# Patient Record
Sex: Male | Born: 1945 | Race: White | Hispanic: No | Marital: Married | Smoking: Former smoker
Health system: Southern US, Community
[De-identification: ages and names within clinical notes are randomized; demographics above are authoritative.]

## PROBLEM LIST (undated history)

## (undated) DIAGNOSIS — F17200 Nicotine dependence, unspecified, uncomplicated: Secondary | ICD-10-CM

## (undated) DIAGNOSIS — N400 Enlarged prostate without lower urinary tract symptoms: Secondary | ICD-10-CM

## (undated) DIAGNOSIS — D126 Benign neoplasm of colon, unspecified: Secondary | ICD-10-CM

## (undated) DIAGNOSIS — E1165 Type 2 diabetes mellitus with hyperglycemia: Secondary | ICD-10-CM

## (undated) DIAGNOSIS — IMO0001 Reserved for inherently not codable concepts without codable children: Secondary | ICD-10-CM

## (undated) DIAGNOSIS — I2119 ST elevation (STEMI) myocardial infarction involving other coronary artery of inferior wall: Secondary | ICD-10-CM

## (undated) DIAGNOSIS — R55 Syncope and collapse: Secondary | ICD-10-CM

## (undated) DIAGNOSIS — R413 Other amnesia: Secondary | ICD-10-CM

## (undated) DIAGNOSIS — I10 Essential (primary) hypertension: Secondary | ICD-10-CM

## (undated) DIAGNOSIS — I2510 Atherosclerotic heart disease of native coronary artery without angina pectoris: Secondary | ICD-10-CM

## (undated) DIAGNOSIS — N200 Calculus of kidney: Secondary | ICD-10-CM

## (undated) DIAGNOSIS — E785 Hyperlipidemia, unspecified: Secondary | ICD-10-CM

## (undated) DIAGNOSIS — E7800 Pure hypercholesterolemia, unspecified: Secondary | ICD-10-CM

## (undated) HISTORY — DX: Other amnesia: R41.3

## (undated) HISTORY — DX: Type 2 diabetes mellitus with hyperglycemia: E11.65

## (undated) HISTORY — DX: Essential (primary) hypertension: I10

## (undated) HISTORY — DX: Nicotine dependence, unspecified, uncomplicated: F17.200

## (undated) HISTORY — DX: Reserved for inherently not codable concepts without codable children: IMO0001

## (undated) HISTORY — PX: TRANSURETHRAL RESECTION OF PROSTATE: SHX73

## (undated) HISTORY — DX: Benign neoplasm of colon, unspecified: D12.6

## (undated) HISTORY — DX: Atherosclerotic heart disease of native coronary artery without angina pectoris: I25.10

## (undated) HISTORY — DX: Pure hypercholesterolemia, unspecified: E78.00

## (undated) HISTORY — DX: Calculus of kidney: N20.0

## (undated) HISTORY — DX: Syncope and collapse: R55

---

## 2007-05-10 DIAGNOSIS — I2119 ST elevation (STEMI) myocardial infarction involving other coronary artery of inferior wall: Secondary | ICD-10-CM

## 2007-05-10 HISTORY — DX: ST elevation (STEMI) myocardial infarction involving other coronary artery of inferior wall: I21.19

## 2007-10-08 HISTORY — PX: CORONARY STENT PLACEMENT: SHX1402

## 2007-10-30 ENCOUNTER — Ambulatory Visit: Payer: Self-pay | Admitting: Cardiovascular Disease

## 2007-10-30 ENCOUNTER — Inpatient Hospital Stay (HOSPITAL_COMMUNITY): Admission: AD | Admit: 2007-10-30 | Discharge: 2007-11-01 | Payer: Self-pay | Admitting: Cardiology

## 2007-11-14 ENCOUNTER — Ambulatory Visit: Payer: Self-pay | Admitting: Cardiovascular Disease

## 2007-11-15 ENCOUNTER — Encounter (HOSPITAL_COMMUNITY): Admission: RE | Admit: 2007-11-15 | Discharge: 2008-02-13 | Payer: Self-pay | Admitting: Cardiology

## 2010-09-21 NOTE — Cardiovascular Report (Signed)
Chad [name], Chad [name]               ACCOUNT NO.:  [id number]   MEDICAL RECORD NO.:  [id number]          PATIENT TYPE:  OIB   LOCATION:  2807                         FACILITY:  [facility]   PHYSICIAN:  Everardo Beals. Juanda Chance, MD, FACCDATE OF BIRTH:  1945/08/13   DATE OF PROCEDURE:  10/30/2007  DATE OF DISCHARGE:                            CARDIAC CATHETERIZATION   CLINICAL HISTORY:  Mr. Halley is 65 years old and has no prior history  of heart disease although he does have hyperlipidemia and diabetes.  He  had the onset of chest pain at 4 p.m. and was brought to the emergency  room by EMS.  His electrocardiogram was transferred via EMS to [facility]  Emergency Department in the Cath Lab, and he was brought directly to the  Cath Lab for intervention following a code STEMI call.  He was seen in  the Cath Lab by Dr. Eden Emms and then myself.   PROCEDURE:  The procedure was performed by the right femoral arterial  sheath and 6-French preformed coronary catheters.  After it was  recognized there was total occlusion in mid right coronary artery,  preparation made for intervention.  The patient was given weight-  adjusted heparin upon ACT greater than 10 seconds.  We used a 6-French  JR4 guiding catheter with side holes and passed a Prowater wire across  the lesion without much difficulty.  This did establish TIMI 2 flow.  We  then went in with a 1.5 x 20-mm clear right catheter and gave a bolus of  intracoronary abciximab through the clear right catheter.  This  established better reperfusion.  We then predilated the lesion with a  2.5 x 15-mm Apex balloon performing 1 inflation up to 8 atmospheres for  30 seconds.  We then deployed a 3.0 x 16-mm Liberte deploying this with  1 inflation of 17 atmospheres for 30 seconds.  We postdilated the stent  with a 3.5 x 12-mm [place] Voyager performing 2 inflations up to 16  atmospheres for 30 seconds.  The patient developed some hypotension,  bradycardia, and nausea requiring  dopamine following reperfusion.  Otherwise, he tolerated the procedure well.  The left ventriculogram was  performed after the coronary intervention.  Right femoral artery was  closed with Angio-Seal at the end of procedure.  The patient tolerated  the procedure well and left the laboratory in satisfactory condition.   RESULTS:  Left main coronary artery:  The left main coronary artery was  free of significant disease.   Left anterior descending artery:  The left anterior descending artery  gave rise to a moderately large diagonal branch and 2 septal  perforators.  There was tandem 50%-70% stenosis in mid LAD after the  diagonal branch and first septal perforator.  There was 80% stenosis in  the first diagonal branch.   Circumflex artery:  The circumflex artery gave rise to a marginal branch  and 2-part large posterolateral branches.  There was 30% narrowing in  the proximal circumflex artery.   Right coronary artery:  The right coronary artery was a moderate-sized  vessel that had a 50%  stenosis in its proximal mid portion, then was  completely occluded at its midportion.   Left ventriculogram:  The left ventriculogram was performed on the RAO  projection showed minimal hypokinesis of the inferobasal wall.  The  estimated ejection fraction was 60%.   Following intracoronary abciximab and following PTCA and stenting of the  lesion in mid right carotid, stenosis improved from 100% to 0% and the  flow improved from TIMI 0 to TIMI 3 flow.   HEMODYNAMIC DATA:  The aortic pressure was 120/59 with mean of 84 and  left ventricular pressure was 125/20.   CONCLUSION:  1. Acute diaphragmatic wall infarction with total of 50% proximal to      mid and total occlusion of the mid right coronary artery, 30% area      in the proximal circumflex artery, 50%-70% stenosis in the mid left      anterior descending artery with 80% stenosis in the first diagonal      branch and minimal inferobasal  wall hypokinesis with an estimated      ejection fraction of 60%.  2. Successful PCI of the totally occluded mid right coronary artery      using intracoronary abciximab through clear right catheter and      stenting with a Liberte bare metal stent with improvement in center      narrowing from 100% to 0% and improvement in flow from TIMI 0 to      TIMI 3 flow.   DISPOSITION:  The patient returned to Post Angio Room for further  observation.  We will plan medical treatment of residual coronary  disease.  We will target discharge on day 2.  The patient had the onset  of chest pain at 1600 and arrived at [facility] at 1730.  The  first balloon inflation was at 1756.  This gave a total balloon time of  26 minutes and refusion time of 1 hour 56 minutes.   The patient was enrolled in the review of study and will receive either  EPO or placebo followed by a late MRI.      Bruce Elvera Lennox Juanda Chance, MD, [facility]  Electronically Signed     BRB/MEDQ  D:  10/30/2007  T:  10/31/2007  Job:  [id number]   cc:   Noralyn Pick. Eden Emms, MD, [facility]

## 2010-09-21 NOTE — Discharge Summary (Signed)
NAMEKOLE, HILYARD NO.:  [id number]   MEDICAL RECORD NO.:  [id number]          PATIENT TYPE:  INP   LOCATION:  2916                         FACILITY:  [facility]   PHYSICIAN:  Noralyn Pick. Eden Emms, MD, FACCDATE OF BIRTH:  [birth year]   DATE OF ADMISSION:  10/30/2007  DATE OF DISCHARGE:  11/01/2007                               DISCHARGE SUMMARY   PRIMARY CARDIOLOGIST:  Theron Arista C. Eden Emms, MD, [facility]   PRIMARY CARE Omri Bertran:  [name], MD at [facility] physician.   DISCHARGE DIAGNOSIS:  Acute inferior ST-segment elevation myocardial  infarction.   SECONDARY DIAGNOSES:  1. Coronary artery disease, status post successful PCI and stenting of      the mid-right coronary artery with placement of 3.0 x 16 mm Liberte      bare metal stent.  The patient has residual 50-70% mid LAD disease,      8% diagonal disease.  2. Type 2 diabetes mellitus, diagnosed few years ago.  3. Hyperlipidemia.  4. Ongoing tobacco abuse.   ALLERGIES:  No known drug allergies.   PROCEDURE:  Left heart rate catheterization with successful PCI and  stenting of the mid-right coronary artery as described above.   HISTORY OF PRESENT ILLNESS:  A 65 year old married Caucasian male  without prior cardiac history.  Prior risk factors include family  history, hyperlipidemia, diabetes, and ongoing tobacco abuse.  He was in  his usual state of health until approximately 4 o'clock p.m. on October 30, 2007, when he developed sudden onset of 10/10 substernal chest pressure  with shortness of breath and diaphoresis.  He eventually presented to  [facility] where ECG showed inferior ST-segment elevation  and code STEMI was activated.  He was treated with 4 baby aspirin and 3  sublingual nitroglycerin with complete relief of chest discomfort on  route to [facility].  He arrived at [facility] at 5:30 p.m. for  emergent cardiac catheterization.  He was pain-free at the time of  arrival, however,  had residual inferior ST-segment elevation.   HOSPITAL COURSE:  The patient underwent emergent left heart cardiac  catheterization revealing an occluded mid-right coronary artery with  moderate LAD, diagonal and proximal RCA disease.  The RCA was felt to be  the infarct vessel and this was successfully stented with 3.0 x 60 mm  Liberte bare metal stent.  EF was 60% with question of slight basal  inferobasal hypokinesis.  The patient tolerated the procedure well and  was enrolled in the Reveal trial.  He was monitored in the coronary  intensive care unit where he eventually peaked his CK at 534, MB at  49.7, and troponin I at 22.83.  Postprocedure, he had no recurrent chest  discomfort.  He has been seen by both cardiac rehab as well as the  tobacco cessation team.  The patient plans to quit smoking cold [place].  He has not had any recurrent symptoms and has been ambulating without  difficulty.  We will plan to discharge him home today and he is to  follow up with Dr. Eden Emms in early  July 2009.  We will plan an  outpatient Myoview in approximately 6-8 weeks in order to evaluate  possible ischemic burden related to his moderate LAD and diagonal  disease.  The patient is being discharged home today in good condition.   DISCHARGE LABORATORY DATA:  Hemoglobin 15.0, hematocrit 44.0, WBC 9.4,  platelets 153, MCV 92.1, sodium 141, potassium 3.8, chloride 107, CO2 of  24, BUN 10, creatinine 1.13, glucose 206, total bilirubin 0.6, alkaline  phosphatase 64, AST 22, ALT 36, albumin 3.7, CK 227, MB 14.0, troponin I  7.53, total cholesterol 137, triglycerides 133, HDL 21, LDL 89, calcium  8.9, TSH 8.119, and BNP 33.0   DISPOSITION:  The patient is being discharged home today in good  condition.   FOLLOWUP PLANS AND APPOINTMENTS:  As noted above.  He is to follow with  Dr. Eden Emms on November 14, 2007, at 9:30 a.m.  He is to follow with his  primary care Cayetano Mikita in the next 1-2 weeks.  We will eventually  arrange  outpatient Myoview in 6-8 weeks to evaluate for possible ischemia in the  LAD and diagonal distributions.   DISCHARGE MEDICATIONS:  1. Aspirin 325 mg daily.  2. Plavix 75 mg daily.  3. Lipitor 80 mg nightly.  4. Lopressor 25 mg b.i.d.  5. Multivitamin 1 daily.  6. Metformin as previously prescribed to be resumed on November 02, 2007.  7. Nitroglycerin 0.4 mg sublingual p.r.n. chest pain.   OUTSTANDING LABORATORY STUDIES:  None.   DURATION OF DISCHARGE ENCOUNTER:  60 minutes including physician time.      Nicolasa Ducking, ANP      Noralyn Pick. Eden Emms, MD, [facility]  Electronically Signed    CB/MEDQ  D:  11/01/2007  T:  11/02/2007  Job:  [id number]   cc:   Noralyn Pick. Eden Emms, MD, [facility]  [name], M.D.

## 2010-09-21 NOTE — Assessment & Plan Note (Signed)
[facility]                            CARDIOLOGY OFFICE NOTE   EDNA, GROVER                        MRN:          [id number]  DATE:11/14/2007                            DOB:          [birth year]    HISTORY:  Mr. Serano returns today for followup.  He was a recent  admission from November 01, 2007, with acute inferior wall MI.  His right  coronary artery was dilated.  He has residual 50-60% mid LAD disease.  He needs a followup stress Myoview at the end of August 2009.   His risk factors included heavy smoking.  He maintains that he has not  smoked since hospital discharge.  He is a diabetic.  His diabetes has  been somewhat out of control.  He indicates his postprandial sugars have  been over 200 and some fasting sugars have been as high as 300.  I told  him I was extremely concerned about this.  He is only taking his  Glucophage once a day for the time being, I told him to take it b.i.d.  He will follow with Dr. [name].  I suspect that the levels of  hemoglobin A1c are in the mid 8 and this increases his risk of  restenosis and recurrent coronary artery disease.  I suspect that he may  need to be on Janumet or another medication.   I urged him to take his fasting sugars regularly and postprandial sugars  and follow up with Dr. Lenise Arena this week.   In regards to his heart, he is doing well.  He is active.  He is even  playing some basketball.  He has not had any significant chest pain,  PND, or orthopnea.  He has some bruising in his leg, which is going down  the inside of his thigh, which is normal.  He did have a bit of an  ecchymosis.   REVIEW OF SYSTEMS:  Otherwise negative.   MEDICATIONS:  1. Aspirin a day.  2. Plavix 75 a day.  3. Lipitor 80 a day.  4. Lopressor 25 b.i.d.  5. Multivitamins.  6. Metformin, he is only taking it once a day.  He is not sure of the      dose, but it is probably 500 mg.   PHYSICAL EXAMINATION:   GENERAL:  Remarkable for a thin [place] male.  There is still nicotine on his breath, but he says it is from his wife.  VITAL SIGNS:  Blood pressure is 118/84, pulse 76 and regular, weight  184, respiratory rate 14, and afebrile.  HEENT:  Unremarkable.  NECK:  Carotids are normal without bruit.  No lymphadenopathy,  thyromegaly, or JVP elevation.  LUNGS:  Clear.  Good diaphragmatic motion.  No wheezing.  S1 and S2.  Normal heart sounds.  PMI normal.  ABDOMEN:  Benign.  Bowel sounds positive.  No AAA, no tenderness, no  bruit, no hepatosplenomegaly, and no hepatojugular reflux.  EXTREMITIES:  He has no right femoral bruit, but he has an ecchymosis  there with a resolving hematoma and some ecchymosis down  the inside of  his right thigh.  There is no evidence of AV fistula or pseudoaneurysm.  Distal pulses are intact.  No edema.  NEUROLOGIC:  Nonfocal.  SKIN:  Warm and dry.  MUSCULOSKELETAL:  No muscular weakness.   IMPRESSION:  1. Recent inferior wall myocardial infarction.  Continue aspirin and      beta-blocker.  2. Residual left anterior descending artery disease.  Follow up      adenosine Myoview at the end of the month.  He has sublingual      nitroglycerin with him.  He will call me if he has any chest pain      to be enroll in cardiac rehab.  3. Hyperlipidemia in the setting of coronary artery disease.  Continue      Lipitor.  Lipid and liver profile when he gets a stress test at the      end of August 2009.  4. Diabetes, poorly controlled.  Follow up with Dr. Lenise Arena as soon as      possible.  Increase current metformin dose to b.i.d.  Consider      addition of Janumet.  Needs to see a dietician.  He likes his pasta      since he is of [place] descent, and I explained to him that the      breads and pastas are very bad for his diabetes.  5. Smoking cessation.  Continue to encourage abstinence.  The      patient's wife continues to smoke.  I explained to her that it is       imperative that she stop, otherwise, Kyser will go back to it.   Overall, I am pleased with his progress.  He wants to go back to work  and as long as he works around cardiac rehab, I told him this would be  fine.     Noralyn Pick. Eden Emms, MD, [facility]  Electronically Signed    PCN/MedQ  DD: 11/14/2007  DT: 11/14/2007  Job #: [id number]

## 2010-09-21 NOTE — H&P (Signed)
NAME:  Chad [name], KACZMARCZYK NO.:  [id number]   MEDICAL RECORD NO.:  [id number]          PATIENT TYPE:  OIB   LOCATION:  2807                         FACILITY:  [facility]   PHYSICIAN:  Noralyn Pick. Eden Emms, MD, FACCDATE OF BIRTH:  1945/12/22   DATE OF ADMISSION:  10/30/2007  DATE OF DISCHARGE:                              HISTORY & PHYSICAL   PRIMARY CARDIOLOGIST:  [facility] being seen by Dr. Eden Emms.   PRIMARY CARE Chantale Leugers:  [name], MD, [facility].   PATIENT PROFILE:  A 65 year old married Caucasian male without prior  cardiac history who presents with acute inferior ST-segment elevation  myocardial infarction.   PROBLEMS:  1. Acute inferior ST-segment elevation myocardial infarction.  2. Diabetes mellitus diagnosed about 3-4 years ago.  3. Hyperlipidemia on statin therapy.  4. Ongoing tobacco abuse with about a 60 pack-year history, currently      smoking 1-1/2 packs per day   HISTORY OF PRESENT ILLNESS:  A 65 year old married Caucasian male  without prior cardiac history.  Risk factors include family history,  hyperlipidemia, diabetes, and ongoing tobacco abuse.  He was in his  usual state of health until approximately 4 o'clock p.m. today when he  developed sudden onset of 10/10 substernal chest pressure with shortness  of breath and diaphoresis while driving.  He subsequently drove home and  his wife convinced him to present to [facility].  There  his ECG showed inferior ST-segment elevation and code STEMI was  activated and EMS called.  He took 4 baby aspirin and 3 sublingual  nitroglycerin tablets with eventual complete relief of pain.  He had  persistent ST-segment elevation inferiorly with reciprocal changes  laterally and he was taken to [facility] arriving at 5:30  p.m.  He is currently undergoing emergent cardiac catheterization.   ALLERGIES:  No known drug allergies.   HOME MEDICATIONS:  Metformin and  Lipitor.   FAMILY HISTORY:  Mother is alive at age 20 with a history CABG.  Father  is alive at age 62 with a history of CABG.   SOCIAL HISTORY:  He lives in [place] with his wife.  He works  Production designer, theatre/television/film for [employer].  He has about a 60 pack-year history  tobacco abuse, currently smoking a pack and half a day.  He denies any  alcohol or drug use.  He has not been exercising.   REVIEW OF SYSTEMS:  Positive for chest pain, shortness of breath,  diaphoresis as outlined in HPI, otherwise all systems reviewed are  negative.   PHYSICAL EXAMINATION:  VITAL SIGNS:  Afebrile, heart rate 76,  respirations 75, blood pressure 111/62, pulse ox 98% on 2 L.  GENERAL:  Pleasant white male in no acute distress.  Awake, alert, and  oriented x3.  HEENT:  Normal.  Nares grossly intact, nonfocal.  SKIN:  Warm and dry without lesions or masses.  NECK:  No bruits or JVD.  LUNGS:  Respirations were unlabored.  Clear to auscultation.  CARDIAC:  Regular S1 and S2.  No S3, S4, or murmurs.  ABDOMEN:  Round, soft, nontender, and nondistended.  Bowel sounds  present x4.  EXTREMITIES:  Warm, dry, and pink.  No clubbing, cyanosis, or edema.  Dorsalis pedis, posterior tibial pulses are 2+ and equal bilaterally.  No femoral bruits noted.   Chest x-ray is pending.  EKG shows sinus rhythm with a normal axis, 2 mm  inferior ST elevation with lateral ST depression and T-wave inversion.  Heart rate is 67.  Lab work is pending.   ASSESSMENT AND PLAN:  1. Acute inferior ST-segment elevation myocardial infarction, code      STEMI activated.  For emergent catheterization plus/minus      percutaneous coronary intervention.  Add Plavix (600 mg given in      the lab x1), aspirin, statin, beta blocker plus/minus ACE      inhibitor, blood pressure tolerates.  Eventual cardiac rehab.  He      has been enrolled in the Reveal trial.  2. Hyperlipidemia.  Check lipids and LFTs.  Add high-dose statin.  3. Diabetes  mellitus.  Hold metformin and sliding-scale insulin.  4. Tobacco use.  Smoking cessation strongly advised and obtain smoking      cessation consult.      Nicolasa Ducking, ANP      Noralyn Pick. Eden Emms, MD, [facility]  Electronically Signed    CB/MEDQ  D:  10/30/2007  T:  10/31/2007  Job:  [id number]

## 2010-12-03 ENCOUNTER — Ambulatory Visit (HOSPITAL_BASED_OUTPATIENT_CLINIC_OR_DEPARTMENT_OTHER)
Admission: RE | Admit: 2010-12-03 | Discharge: 2010-12-04 | Disposition: A | Discharge status: Home / Self Care | Payer: Medicare Other | Source: Ambulatory Visit | Attending: Urology | Admitting: Urology

## 2010-12-03 DIAGNOSIS — E119 Type 2 diabetes mellitus without complications: Secondary | ICD-10-CM | POA: Insufficient documentation

## 2010-12-03 DIAGNOSIS — Z79899 Other long term (current) drug therapy: Secondary | ICD-10-CM | POA: Insufficient documentation

## 2010-12-03 DIAGNOSIS — N400 Enlarged prostate without lower urinary tract symptoms: Secondary | ICD-10-CM | POA: Insufficient documentation

## 2010-12-03 DIAGNOSIS — Z7982 Long term (current) use of aspirin: Secondary | ICD-10-CM | POA: Insufficient documentation

## 2010-12-03 DIAGNOSIS — N210 Calculus in bladder: Secondary | ICD-10-CM | POA: Insufficient documentation

## 2010-12-03 DIAGNOSIS — E785 Hyperlipidemia, unspecified: Secondary | ICD-10-CM | POA: Insufficient documentation

## 2010-12-03 DIAGNOSIS — I2510 Atherosclerotic heart disease of native coronary artery without angina pectoris: Secondary | ICD-10-CM | POA: Insufficient documentation

## 2010-12-03 DIAGNOSIS — I10 Essential (primary) hypertension: Secondary | ICD-10-CM | POA: Insufficient documentation

## 2010-12-03 DIAGNOSIS — I252 Old myocardial infarction: Secondary | ICD-10-CM | POA: Insufficient documentation

## 2010-12-03 LAB — POCT I-STAT 4, (NA,K, GLUC, HGB,HCT)
Glucose, Bld: 179 mg/dL — ABNORMAL HIGH (ref 70–99)
HCT: 48 % (ref 39.0–52.0)
Hemoglobin: 16.3 g/dL (ref 13.0–17.0)
Sodium: 142 mEq/L (ref 135–145)

## 2010-12-04 LAB — GLUCOSE, CAPILLARY
Glucose-Capillary: 185 mg/dL — ABNORMAL HIGH (ref 70–99)
Glucose-Capillary: 239 mg/dL — ABNORMAL HIGH (ref 70–99)

## 2010-12-18 NOTE — Op Note (Signed)
Chad [name], Chad [name]              ACCOUNT NO.:  [id number]  MEDICAL RECORD NO.:  [id number]  LOCATION:                                 FACILITY:  PHYSICIAN:  Chad [name]. Chad [name], M.D.  DATE OF BIRTH:  DATE OF PROCEDURE:  12/03/2010 DATE OF DISCHARGE:                              OPERATIVE REPORT   PREOPERATIVE DIAGNOSES: 1. Benign prostatic hypertrophy. 2. Multiple bladder calculi.  POSTOPERATIVE DIAGNOSES: 1. Benign prostatic hypertrophy. 2. Multiple bladder calculi.  PRINCIPAL PROCEDURE: 1. Transurethral resection of prostate. 2. Cystolitholapaxy with stone breaker.  SURGEON:  Chad [name]. Chad Holzman, MD  ANESTHESIA:  General with LMA.  COMPLICATIONS:  None.  SPECIMEN:  Prostate chips to Pathology, stone fragments to the patient's family.  BRIEF HISTORY:  This 65 year old gentleman presents at this time for management of multiple bladder calculi with an aggregate diameter of approximately 10 cm, as well as TURP.  The patient presented to my office on November 05, 2010, with recurrent gross hematuria and lower urinary tract symptoms including frequency, urgency, and occasional urgency incontinence.  The patient is a smoker.  He underwent cystoscopy, which revealed multiple bladder calculi as well as a very large obstructed prostate gland.  It was recommended that he undergo TURP as well as cystolitholapaxy of the stones.  He presents for that procedure.  DESCRIPTION OF PROCEDURE:  The patient was identified in the holding area and received preoperative IV antibiotics.  He was taken to the operating room where general anesthetic was administered using the LMA. He was placed in the dorsal lithotomy position.  Genitalia and perineum were prepped and draped.  Time-out was then performed.  Procedure then commenced.  A 28-French resectoscope sheath was passed using the obturator, and then the working bridge was placed.  I used the stone breaker to break up all the  calculi.  There were approximately 10 stones within the bladder, each of which measured at least 1 cm in size. Multiple fragments were made of each stone with a stone breaker. Approximately 6 canisters were used to fire the device.  Following fragmentation of all of the large calculi and the smaller fragments, the fragments were carefully irrigated from the bladder with the Microvasive irrigation setup.  Looking in the bladder afterwards, no significant stone matter was seen.  At this point, I took out the working bridge and placed the resectoscope element with a cutting loop.  I then resected the patient's entire right prostatic lobe, starting at the 12 o'clock position, working laterally and posteriorly to the 6 o'clock position. There was a significant median lobe, which was intravesical, which was also resected as cleanly as possible.  There was no evident obstruction after the right lobe and the median lobe were resected.  I also resected a small part of the left prostatic lobe.  After resection down to the surgical capsule circumferentially along the 12 to 6 o'clock positions, I used the gyrus button to electrocoagulate the remaining tissue.  No significant bleeding was seen at this point.  The chips were irrigated from the bladder, and sent for permanent specimen.  Following this, I re- inspected the prostatic fossa and no bleeding was seen.  I then  removed the scope and placed a 22-French 3-way Foley catheter with 30 mL in the balloon.  This was hooked to overhead irrigation.  The patient tolerated the procedure well.  He was awakened and then taken to PACU in stable condition.     Chad [name]. Chad [name], M.D.     SMD/MEDQ  D:  12/14/2010  T:  12/15/2010  Job:  [id number]  cc:   Chad [name], M.D. Fax: [phone]  Electronically Signed by Chad [name] M.D. on 12/18/2010 09:45:21 AM

## 2011-02-03 LAB — PROTIME-INR: INR: 1.1

## 2011-02-03 LAB — CARDIAC PANEL(CRET KIN+CKTOT+MB+TROPI)
CK, MB: 30.4 — ABNORMAL HIGH
Relative Index: 8.7 — ABNORMAL HIGH
Relative Index: 9.3 — ABNORMAL HIGH
Relative Index: INVALID
Total CK: 64
Troponin I: 0.02
Troponin I: 7.53

## 2011-02-03 LAB — COMPREHENSIVE METABOLIC PANEL
ALT: 36
AST: 22
Albumin: 3.7
CO2: 25
Calcium: 8.8
Creatinine, Ser: 0.95
GFR calc Af Amer: >60
GFR calc non Af Amer: >60
Sodium: 138
Total Protein: 6

## 2011-02-03 LAB — LIPID PANEL
HDL: 17 — ABNORMAL LOW
LDL Cholesterol: 89
Triglycerides: 213 — ABNORMAL HIGH
VLDL: 27
VLDL: 43 — ABNORMAL HIGH

## 2011-02-03 LAB — BASIC METABOLIC PANEL
BUN: 10
BUN: 13
BUN: 14
CO2: 24
CO2: 28
Calcium: 8.7
Chloride: 105
Chloride: 107
Creatinine, Ser: 1.02
Creatinine, Ser: 1.13
GFR calc non Af Amer: >60
Glucose, Bld: 216 — ABNORMAL HIGH
Potassium: 4

## 2011-02-03 LAB — CBC
HCT: 42
Hemoglobin: 14.5
MCHC: 34.2
MCHC: 34.5
MCV: 91.9
MCV: 92.1
MCV: 92.1
Platelets: 153
Platelets: 188
RBC: 4.68
RBC: 4.78
RDW: 13.6
WBC: 7.9

## 2011-02-03 LAB — B-NATRIURETIC PEPTIDE (CONVERTED LAB): Pro B Natriuretic peptide (BNP): 33

## 2011-02-03 LAB — DIFFERENTIAL
Basophils Absolute: 0.1
Basophils Relative: 1
Eosinophils Absolute: 0.1
Eosinophils Relative: 1
Lymphocytes Relative: 19
Lymphs Abs: 2.3
Monocytes Absolute: 0.6
Monocytes Relative: 6
Neutro Abs: 9.2 — ABNORMAL HIGH
Neutrophils Relative %: 70

## 2011-02-03 LAB — APTT: aPTT: >200

## 2011-02-03 LAB — RETICULOCYTES
RBC.: 4.68
Retic Count, Absolute: 65.5
Retic Count, Absolute: 66.8
Retic Ct Pct: 1.4

## 2011-02-03 LAB — PLATELET COUNT: Platelets: 167

## 2011-02-03 LAB — TSH: TSH: 1.058

## 2011-02-07 LAB — GLUCOSE, CAPILLARY
Glucose-Capillary: 279 — ABNORMAL HIGH
Glucose-Capillary: 339 — ABNORMAL HIGH

## 2011-02-09 LAB — GLUCOSE, CAPILLARY

## 2011-10-30 ENCOUNTER — Emergency Department (HOSPITAL_COMMUNITY): Payer: Medicare Other

## 2011-10-30 ENCOUNTER — Encounter (HOSPITAL_COMMUNITY): Payer: Self-pay | Admitting: Emergency Medicine

## 2011-10-30 ENCOUNTER — Observation Stay (HOSPITAL_COMMUNITY)
Admission: EM | Admit: 2011-10-30 | Discharge: 2011-10-31 | Disposition: A | Discharge status: Home / Self Care | Payer: Medicare Other | Source: Ambulatory Visit | Attending: Internal Medicine | Admitting: Internal Medicine

## 2011-10-30 ENCOUNTER — Observation Stay (HOSPITAL_COMMUNITY): Payer: Medicare Other

## 2011-10-30 DIAGNOSIS — M47812 Spondylosis without myelopathy or radiculopathy, cervical region: Secondary | ICD-10-CM | POA: Insufficient documentation

## 2011-10-30 DIAGNOSIS — I10 Essential (primary) hypertension: Secondary | ICD-10-CM

## 2011-10-30 DIAGNOSIS — S2239XA Fracture of one rib, unspecified side, initial encounter for closed fracture: Secondary | ICD-10-CM

## 2011-10-30 DIAGNOSIS — E1159 Type 2 diabetes mellitus with other circulatory complications: Secondary | ICD-10-CM

## 2011-10-30 DIAGNOSIS — S2249XA Multiple fractures of ribs, unspecified side, initial encounter for closed fracture: Secondary | ICD-10-CM

## 2011-10-30 DIAGNOSIS — I999 Unspecified disorder of circulatory system: Secondary | ICD-10-CM | POA: Insufficient documentation

## 2011-10-30 DIAGNOSIS — E785 Hyperlipidemia, unspecified: Secondary | ICD-10-CM | POA: Insufficient documentation

## 2011-10-30 DIAGNOSIS — W19XXXA Unspecified fall, initial encounter: Secondary | ICD-10-CM | POA: Insufficient documentation

## 2011-10-30 DIAGNOSIS — Y92009 Unspecified place in unspecified non-institutional (private) residence as the place of occurrence of the external cause: Secondary | ICD-10-CM | POA: Insufficient documentation

## 2011-10-30 DIAGNOSIS — N400 Enlarged prostate without lower urinary tract symptoms: Secondary | ICD-10-CM

## 2011-10-30 DIAGNOSIS — M5030 Other cervical disc degeneration, unspecified cervical region: Secondary | ICD-10-CM | POA: Insufficient documentation

## 2011-10-30 DIAGNOSIS — Z7902 Long term (current) use of antithrombotics/antiplatelets: Secondary | ICD-10-CM | POA: Insufficient documentation

## 2011-10-30 DIAGNOSIS — E119 Type 2 diabetes mellitus without complications: Secondary | ICD-10-CM | POA: Insufficient documentation

## 2011-10-30 DIAGNOSIS — R079 Chest pain, unspecified: Secondary | ICD-10-CM | POA: Insufficient documentation

## 2011-10-30 DIAGNOSIS — R55 Syncope and collapse: Secondary | ICD-10-CM

## 2011-10-30 DIAGNOSIS — S022XXA Fracture of nasal bones, initial encounter for closed fracture: Secondary | ICD-10-CM

## 2011-10-30 DIAGNOSIS — R51 Headache: Secondary | ICD-10-CM | POA: Insufficient documentation

## 2011-10-30 DIAGNOSIS — I2510 Atherosclerotic heart disease of native coronary artery without angina pectoris: Secondary | ICD-10-CM

## 2011-10-30 HISTORY — DX: Benign prostatic hyperplasia without lower urinary tract symptoms: N40.0

## 2011-10-30 HISTORY — DX: Hyperlipidemia, unspecified: E78.5

## 2011-10-30 HISTORY — DX: Atherosclerotic heart disease of native coronary artery without angina pectoris: I25.10

## 2011-10-30 HISTORY — DX: ST elevation (STEMI) myocardial infarction involving other coronary artery of inferior wall: I21.19

## 2011-10-30 LAB — RAPID URINE DRUG SCREEN, HOSP PERFORMED
Amphetamines: NOT DETECTED
Opiates: NOT DETECTED

## 2011-10-30 LAB — COMPREHENSIVE METABOLIC PANEL
BUN: 23 mg/dL (ref 6–23)
CO2: 27 mEq/L (ref 19–32)
Calcium: 9.2 mg/dL (ref 8.4–10.5)
Chloride: 104 mEq/L (ref 96–112)
Creatinine, Ser: 1.17 mg/dL (ref 0.50–1.35)
GFR calc Af Amer: 73 mL/min — ABNORMAL LOW (ref >90)
GFR calc non Af Amer: 63 mL/min — ABNORMAL LOW (ref >90)
Glucose, Bld: 246 mg/dL — ABNORMAL HIGH (ref 70–99)
Total Bilirubin: 0.5 mg/dL (ref 0.3–1.2)

## 2011-10-30 LAB — DIFFERENTIAL
Eosinophils Relative: 2 % (ref 0–5)
Lymphocytes Relative: 19 % (ref 12–46)
Lymphs Abs: 1.5 10*3/uL (ref 0.7–4.0)
Monocytes Absolute: 0.5 10*3/uL (ref 0.1–1.0)
Monocytes Relative: 5 % (ref 3–12)

## 2011-10-30 LAB — CBC
HCT: 43.3 % (ref 39.0–52.0)
Hemoglobin: 15.3 g/dL (ref 13.0–17.0)
MCV: 89.3 fL (ref 78.0–100.0)
RBC: 4.85 MIL/uL (ref 4.22–5.81)
RDW: 13.3 % (ref 11.5–15.5)
WBC: 8.3 10*3/uL (ref 4.0–10.5)

## 2011-10-30 LAB — CARDIAC PANEL(CRET KIN+CKTOT+MB+TROPI): Relative Index: 2.5 (ref 0.0–2.5)

## 2011-10-30 LAB — ETHANOL: Alcohol, Ethyl (B): <11 mg/dL (ref 0–11)

## 2011-10-30 LAB — URINALYSIS, ROUTINE W REFLEX MICROSCOPIC
Nitrite: NEGATIVE
Protein, ur: NEGATIVE mg/dL
Specific Gravity, Urine: >1.046 — ABNORMAL HIGH (ref 1.005–1.030)
Urobilinogen, UA: 1 mg/dL (ref 0.0–1.0)

## 2011-10-30 LAB — PRO B NATRIURETIC PEPTIDE: Pro B Natriuretic peptide (BNP): 41.4 pg/mL (ref 0–125)

## 2011-10-30 LAB — GLUCOSE, CAPILLARY

## 2011-10-30 LAB — POCT I-STAT TROPONIN I

## 2011-10-30 MED ORDER — INSULIN ASPART 100 UNIT/ML ~~LOC~~ SOLN: 0.0000 [IU] | Freq: Every day | SUBCUTANEOUS | Status: DC

## 2011-10-30 MED ORDER — SODIUM CHLORIDE 0.9 % IJ SOLN
3.0000 mL | Freq: Two times a day (BID) | INTRAMUSCULAR | Status: DC
  Administered 2011-10-30: 3 mL via INTRAVENOUS

## 2011-10-30 MED ORDER — ATORVASTATIN CALCIUM 40 MG PO TABS
40.0000 mg | ORAL_TABLET | Freq: Every day | ORAL | Status: DC
  Administered 2011-10-30: 40 mg via ORAL
  Filled 2011-10-30 (×2): qty 1

## 2011-10-30 MED ORDER — SODIUM CHLORIDE 0.9 % IV SOLN
INTRAVENOUS | Status: DC
  Administered 2011-10-30: 125 mL/h via INTRAVENOUS

## 2011-10-30 MED ORDER — GLIPIZIDE 5 MG PO TABS
5.0000 mg | ORAL_TABLET | Freq: Two times a day (BID) | ORAL | Status: DC
  Filled 2011-10-30 (×4): qty 1

## 2011-10-30 MED ORDER — ONDANSETRON HCL 4 MG/2ML IJ SOLN: 4.0000 mg | Freq: Four times a day (QID) | INTRAMUSCULAR | Status: DC | PRN

## 2011-10-30 MED ORDER — HYDROMORPHONE HCL PF 1 MG/ML IJ SOLN
1.0000 mg | INTRAMUSCULAR | Status: DC | PRN
  Administered 2011-10-31: 1 mg via INTRAVENOUS
  Filled 2011-10-30: qty 1

## 2011-10-30 MED ORDER — ONDANSETRON HCL 4 MG PO TABS: 4.0000 mg | ORAL_TABLET | Freq: Four times a day (QID) | ORAL | Status: DC | PRN

## 2011-10-30 MED ORDER — ONDANSETRON HCL 4 MG/2ML IJ SOLN
4.0000 mg | Freq: Once | INTRAMUSCULAR | Status: AC
  Administered 2011-10-30: 4 mg via INTRAVENOUS
  Filled 2011-10-30: qty 2

## 2011-10-30 MED ORDER — INSULIN ASPART 100 UNIT/ML ~~LOC~~ SOLN
0.0000 [IU] | Freq: Three times a day (TID) | SUBCUTANEOUS | Status: DC
  Administered 2011-10-31: 2 [IU] via SUBCUTANEOUS
  Administered 2011-10-31: 3 [IU] via SUBCUTANEOUS

## 2011-10-30 MED ORDER — LISINOPRIL 40 MG PO TABS
40.0000 mg | ORAL_TABLET | Freq: Every day | ORAL | Status: DC
  Administered 2011-10-30 – 2011-10-31 (×2): 40 mg via ORAL
  Filled 2011-10-30 (×2): qty 1

## 2011-10-30 MED ORDER — HYDRALAZINE HCL 20 MG/ML IJ SOLN
10.0000 mg | Freq: Four times a day (QID) | INTRAMUSCULAR | Status: DC | PRN
  Filled 2011-10-30: qty 0.5

## 2011-10-30 MED ORDER — NITROGLYCERIN 0.4 MG SL SUBL: 0.4000 mg | SUBLINGUAL_TABLET | SUBLINGUAL | Status: DC | PRN

## 2011-10-30 MED ORDER — HYDROCODONE-ACETAMINOPHEN 5-325 MG PO TABS: 1.0000 | ORAL_TABLET | ORAL | Status: DC | PRN

## 2011-10-30 MED ORDER — HYDROMORPHONE HCL PF 2 MG/ML IJ SOLN
2.0000 mg | Freq: Once | INTRAMUSCULAR | Status: AC
  Administered 2011-10-30: 2 mg via INTRAVENOUS
  Filled 2011-10-30: qty 1

## 2011-10-30 MED ORDER — ALUM & MAG HYDROXIDE-SIMETH 200-200-20 MG/5ML PO SUSP: 30.0000 mL | Freq: Four times a day (QID) | ORAL | Status: DC | PRN

## 2011-10-30 MED ORDER — ACETAMINOPHEN 325 MG PO TABS: 650.0000 mg | ORAL_TABLET | Freq: Four times a day (QID) | ORAL | Status: DC | PRN

## 2011-10-30 MED ORDER — CARVEDILOL 25 MG PO TABS
25.0000 mg | ORAL_TABLET | Freq: Two times a day (BID) | ORAL | Status: DC
  Administered 2011-10-30 – 2011-10-31 (×2): 25 mg via ORAL
  Filled 2011-10-30 (×4): qty 1

## 2011-10-30 MED ORDER — SODIUM CHLORIDE 0.9 % IV SOLN
INTRAVENOUS | Status: DC
  Administered 2011-10-31: 06:00:00 via INTRAVENOUS

## 2011-10-30 MED ORDER — ACETAMINOPHEN 650 MG RE SUPP: 650.0000 mg | Freq: Four times a day (QID) | RECTAL | Status: DC | PRN

## 2011-10-30 MED ORDER — ADULT MULTIVITAMIN W/MINERALS CH
1.0000 | ORAL_TABLET | Freq: Every day | ORAL | Status: DC
  Administered 2011-10-30 – 2011-10-31 (×2): 1 via ORAL
  Filled 2011-10-30 (×2): qty 1

## 2011-10-30 MED ORDER — IOHEXOL 350 MG/ML SOLN
80.0000 mL | Freq: Once | INTRAVENOUS | Status: AC | PRN
  Administered 2011-10-30: 80 mL via INTRAVENOUS

## 2011-10-30 NOTE — Consult Note (Signed)
Cardiology Consult Note  Admit date: 10/30/2011 Name: Chad [name] 66 y.o.  male DOB:  30-Aug-1945 MRN:  [id number]  Today's date:  10/30/2011  Referring Physician:   Dr. Thad Ranger   Primary Physician:   Dr. Lanier Ensign  Reason for Consultation:    Syncope  IMPRESSIONS: 1. Syncopal episode likely vasovagal related either to micturition or possibly orthostasis from recent change in blood pressure medication 2. Coronary artery disease with previous inferior infarction treated with stent placement in 2009 3. Hypertension 4. Type 2 diabetes mellitus 5. Hyperlipidemia  RECOMMENDATION: The patient has had a syncopal episode following micturition today. He has felt somewhat fatigued and tired the past few days since his blood pressure medication was changed. 1. Obtain orthostatic blood pressures 2. Obtain echocardiogram 3. Serial cardiac enzymes to be sure this was not ischemia. He may need an ischemic evaluation if one has not been done recently as an outpatient 4. Chest CT scan because of elevated d-dimer to be sure he has not had a pulmonary embolus 5. Discontinuation of cigarette abuse  HISTORY: This 66 year old male has a history of an inferior infarction 2009 treated with a stent to the right coronary artery. He had residual LAD disease but has done well since then and switched to Dr. Katrinka Blazing is his cardiologist. He was recently seen in May and was not having any angina or symptoms of heart failure. He was noted to have elevated blood pressure was placed on lisinopril/HCTZ. He has not felt well the past few days complaining of fatigue feeling very sleepy and there is a question that he has been having some confusion. He has also noticed increased burping and indigestion the past few days.  This morning he got up to go to the bathroom after sitting at a computer and after having urinated he was coming out of the bathroom felt dizzy and lightheaded somewhat hot and then had a syncopal  episode falling and breaking his nose and injuring his chin. He was brought to the emergency room was found to have some rib fractures as well as a broken nose. He had not had any angina and denied PND, orthopnea or edema. He has not had any previous episodes of syncope.   Past Medical History  Diagnosis Date  . Coronary artery disease   . Diabetes mellitus     diagnosed age 39  . Hypertension   . Hyperlipidemia   . BPH (benign prostatic hyperplasia)   . Inferior MI 2009     Past Surgical History  Procedure Date  . Coronary stent placement 2009    Liberte stent to RCA  . Transurethral resection of prostate     Allergies:   has no known allergies.   Medications: Prior to Admission medications   Medication Sig Start Date End Date Taking? Authorizing Provider  atorvastatin (LIPITOR) 40 MG tablet Take 40 mg by mouth daily.   Yes Historical Provider, MD  carvedilol (COREG) 25 MG tablet Take 25 mg by mouth 2 (two) times daily with a meal.   Yes Historical Provider, MD  glipiZIDE (GLUCOTROL) 10 MG tablet Take 5 mg by mouth 2 (two) times daily before a meal.   Yes Historical Provider, MD  lisinopril (PRINIVIL,ZESTRIL) 20 MG tablet Take 20 mg by mouth daily.   Yes Historical Provider, MD  lisinopril-hydrochlorothiazide (PRINZIDE,ZESTORETIC) 20-25 MG per tablet Take 1 tablet by mouth daily.   Yes Historical Provider, MD  metFORMIN (GLUCOPHAGE) 1000 MG tablet Take 1,000 mg by mouth 2 (two)  times daily with a meal.   Yes Historical Provider, MD  Multiple Vitamin (MULTIVITAMIN WITH MINERALS) TABS Take 1 tablet by mouth daily. [facility]   Yes Historical Provider, MD  nitroGLYCERIN (NITROSTAT) 0.4 MG SL tablet Place 0.4 mg under the tongue every 5 (five) minutes as needed.   Yes Historical Provider, MD   Family History: Family Status  Relation Status Death Age  . Father Deceased 60    died of heart and lung trouble  . Mother Deceased 59    died of diabetes  . Brother Alive   .  Sister Alive    Social History:   reports that he has been smoking Cigarettes after having quit after his MI.  He currently smokes 1/2 - 3/4 PPD.Marland Kitchen  He has a 50 pack-year smoking history. He has never used smokeless tobacco. He reports that he does not drink alcohol or use illicit drugs.   History   Social History Narrative   Retired from [employer]. Married greater than 40 years . 4 children.    Review of Systems: He was diagnosed with possible glaucoma and has seen a physician but whether he has this or not is unclear at this time. His mild dyspnea with exertion. He has a history of nocturia as well as some mild urgency. He does not have significant arthritis. He has noted excessive belching as well as some dyspepsia recently. Other than as noted above the remainder of the review of systems is unremarkable.  Physical Exam: Blood pressure 132/113, pulse 72, temperature 97.5 F (36.4 C), temperature source Oral, resp. rate 16, height 6\' 1"  (1.854 m), weight 82.1 kg (181 lb), SpO2 92.00%.    General appearance: alert, appears stated age and no distress Neck: no adenopathy, no carotid bruit, no JVD and supple, symmetrical, trachea midline Lungs: clear to auscultation bilaterally Chest wall: left sided chest wall tenderness Heart: regular rate and rhythm, S1, S2 normal, no murmur, click, rub or gallop Abdomen: soft, non-tender; bowel sounds normal; no masses,  no organomegaly Rectal: deferred Pulses: 2+ and symmetric Skin: Skin color, texture, turgor normal. No rashes or lesions Neurologic: Grossly normal  Labs: CBC    Component Value Date/Time   WBC 8.3 10/30/2011 1055   RBC 4.85 10/30/2011 1055   HGB 15.3 10/30/2011 1055   HCT 43.3 10/30/2011 1055   PLT 176 10/30/2011 1055   MCV 89.3 10/30/2011 1055   MCH 31.5 10/30/2011 1055   MCHC 35.3 10/30/2011 1055   RDW 13.3 10/30/2011 1055   LYMPHSABS 1.5 10/30/2011 1055   MONOABS 0.5 10/30/2011 1055   EOSABS 0.2 10/30/2011 1055   BASOSABS 0.0  10/30/2011 1055   CMP     Component Value Date/Time   NA 140 10/30/2011 1055   K 5.0 10/30/2011 1055   CL 104 10/30/2011 1055   CO2 27 10/30/2011 1055   GLUCOSE 246* 10/30/2011 1055   BUN 23 10/30/2011 1055   CREATININE 1.17 10/30/2011 1055   CALCIUM 9.2 10/30/2011 1055   PROT 6.7 10/30/2011 1055   ALBUMIN 3.9 10/30/2011 1055   AST 44* 10/30/2011 1055   ALT 70* 10/30/2011 1055   ALKPHOS 79 10/30/2011 1055   BILITOT 0.5 10/30/2011 1055   GFRNONAA 63* 10/30/2011 1055   GFRAA 73* 10/30/2011 1055   BNP (last 3 results)  Basename 10/30/11 1056  PROBNP 41.4   Cardiac Panel (last 3 results) Troponin (Point of Care Test)  [facility] 10/30/11 1114  TROPIPOC 0.00     Radiology: No chest  x-ray available for review. Nasal fracture noted evidently.  EKG: Normal EKG, normal PR interval. No ischemic ST changes  Signed:  W. Ashley Royalty MD [facility]   Cardiology Consultant  10/30/2011, 8:34 PM

## 2011-10-30 NOTE — ED Provider Notes (Signed)
History     CSN: [id number]  Arrival date & time 10/30/11  1031   First MD Initiated Contact with Patient 10/30/11 1039      Chief Complaint  Patient presents with  . Loss of Consciousness    (Consider location/radiation/quality/duration/timing/severity/associated sxs/prior treatment) HPI Comments: The patient is a 66 year old man who was coming out of the bathroom and fainted. He fell flat on his face, with his right arm under him. He has no recall of this injury. EMS was called and brought him to [facility] and mobilized on a backboard with a cervical collar. The paramedics noted no focal neurologic findings. I noted that he had facial deformity of his nose, and pain in the right ribs. They noted a prior history of coronary artery disease, diabetes and hypertension.  Patient is a 66 y.o. male presenting with syncope. The history is provided by the EMS personnel and medical records. No language interpreter was used.  Loss of Consciousness This is a new problem. The current episode started less than 1 hour ago. The problem has been rapidly improving. Pertinent negatives include no chest pain. Associated symptoms comments: Pain in the nose and right rib cage.. Nothing aggravates the symptoms. Nothing relieves the symptoms. Treatments tried: Patient was immobilized on a backboard with a cervical collar and transported to [facility] cone the ED by EMS.    History reviewed. No pertinent past medical history.  No past surgical history on file.  No family history on file.  History  Substance Use Topics  . Smoking status: Not on file  . Smokeless tobacco: Not on file  . Alcohol Use: Not on file      Review of Systems  Constitutional: Negative.   HENT: Positive for nosebleeds and facial swelling. Negative for tinnitus.   Eyes: Negative.   Respiratory:       Right-sided rib pain.  Cardiovascular: Positive for syncope. Negative for chest pain.  Gastrointestinal: Negative.     Genitourinary: Negative.   Musculoskeletal: Negative.   Skin: Negative.   Neurological: Negative.   Psychiatric/Behavioral: Negative.     Allergies  Review of patient's allergies indicates no known allergies.  Home Medications   Current Outpatient Rx  Name Route Sig Dispense Refill  . ATORVASTATIN CALCIUM 40 MG PO TABS Oral Take 40 mg by mouth daily.    Marland Kitchen CARVEDILOL 25 MG PO TABS Oral Take 25 mg by mouth 2 (two) times daily with a meal.    . GLIPIZIDE 10 MG PO TABS Oral Take 5 mg by mouth 2 (two) times daily before a meal.    . LISINOPRIL 20 MG PO TABS Oral Take 20 mg by mouth daily.    Marland Kitchen LISINOPRIL-HYDROCHLOROTHIAZIDE 20-25 MG PO TABS Oral Take 1 tablet by mouth daily.    Marland Kitchen METFORMIN HCL 1000 MG PO TABS Oral Take 1,000 mg by mouth 2 (two) times daily with a meal.    . ADULT MULTIVITAMIN W/MINERALS CH Oral Take 1 tablet by mouth daily. [facility]    . NITROGLYCERIN 0.4 MG SL SUBL Sublingual Place 0.4 mg under the tongue every 5 (five) minutes as needed.      BP 140/112  Pulse 70  Temp 97.9 F (36.6 C) (Oral)  Resp 18  SpO2 99%  Physical Exam  Nursing note and vitals reviewed. Constitutional: He is oriented to person, place, and time. He appears well-developed and well-nourished. Distressed: has swelling of his nose and dried blood in the nares. He is immobilized on  a cervical collar and backboard.  HENT:  Head: Normocephalic and atraumatic.  Right Ear: External ear normal.  Left Ear: External ear normal.  Mouth/Throat: Oropharynx is clear and moist.  Eyes: Conjunctivae and EOM are normal. Pupils are equal, round, and reactive to light.  Neck: Normal range of motion. Neck supple.  Cardiovascular: Normal rate, regular rhythm and normal heart sounds.   Pulmonary/Chest: Effort normal and breath sounds normal.       Tenderness in the right ribs over the right anterior costal margin.  Abdominal: Soft. Bowel sounds are normal.  Musculoskeletal: Normal range of motion.  He exhibits no edema and no tenderness.  Neurological: He is alert and oriented to person, place, and time.       No sensory or motor deficit. No recall of today's injury.  Skin: Skin is warm and dry.  Psychiatric: He has a normal mood and affect. His behavior is normal.    ED Course  Procedures (including critical care time)   Labs Reviewed  CBC  DIFFERENTIAL  COMPREHENSIVE METABOLIC PANEL  URINALYSIS, ROUTINE W REFLEX MICROSCOPIC  URINE CULTURE  D-DIMER, QUANTITATIVE  PRO B NATRIURETIC PEPTIDE  ETHANOL  URINE RAPID DRUG SCREEN (HOSP PERFORMED)  ACETAMINOPHEN LEVEL  SALICYLATE LEVEL   11:24 AM Patient was seen and had physical examination. Height of backboard and cervical collar. Laboratory tests EKG and x-rays were ordered.  11:24 AM  Date: 10/30/2011  Rate: 67  Rhythm: normal sinus rhythm  QRS Axis: normal  Intervals: normal  ST/T Wave abnormalities: normal  Conduction Disutrbances:none  Narrative Interpretation: Normal EKG  Old EKG Reviewed: unchanged   1:31 PM Results for orders placed during the hospital encounter of 10/30/11  CBC      Component Value Range   WBC 8.3  4.0 - 10.5 K/uL   RBC 4.85  4.22 - 5.81 MIL/uL   Hemoglobin 15.3  13.0 - 17.0 g/dL   HCT 09.8  11.9 - 14.7 %   MCV 89.3  78.0 - 100.0 fL   MCH 31.5  26.0 - 34.0 pg   MCHC 35.3  30.0 - 36.0 g/dL   RDW 82.9  56.2 - 13.0 %   Platelets 176  150 - 400 K/uL  DIFFERENTIAL      Component Value Range   Neutrophils Relative 73  43 - 77 %   Neutro Abs 6.1  1.7 - 7.7 K/uL   Lymphocytes Relative 19  12 - 46 %   Lymphs Abs 1.5  0.7 - 4.0 K/uL   Monocytes Relative 5  3 - 12 %   Monocytes Absolute 0.5  0.1 - 1.0 K/uL   Eosinophils Relative 2  0 - 5 %   Eosinophils Absolute 0.2  0.0 - 0.7 K/uL   Basophils Relative 1  0 - 1 %   Basophils Absolute 0.0  0.0 - 0.1 K/uL  COMPREHENSIVE METABOLIC PANEL      Component Value Range   Sodium 140  135 - 145 mEq/L   Potassium 5.0  3.5 - 5.1 mEq/L   Chloride  104  96 - 112 mEq/L   CO2 27  19 - 32 mEq/L   Glucose, Bld 246 (*) 70 - 99 mg/dL   BUN 23  6 - 23 mg/dL   Creatinine, Ser 8.65  0.50 - 1.35 mg/dL   Calcium 9.2  8.4 - 78.4 mg/dL   Total Protein 6.7  6.0 - 8.3 g/dL   Albumin 3.9  3.5 - 5.2 g/dL  AST 44 (*) 0 - 37 U/L   ALT 70 (*) 0 - 53 U/L   Alkaline Phosphatase 79  39 - 117 U/L   Total Bilirubin 0.5  0.3 - 1.2 mg/dL   GFR calc non Af Amer 63 (*) >90 mL/min   GFR calc Af Amer 73 (*) >90 mL/min  D-DIMER, QUANTITATIVE      Component Value Range   D-Dimer, Quant 3.66 (*) 0.00 - 0.48 ug/mL-FEU  PRO B NATRIURETIC PEPTIDE      Component Value Range   Pro B Natriuretic peptide (BNP) 41.4  0 - 125 pg/mL  ETHANOL      Component Value Range   Alcohol, Ethyl (B) <11  0 - 11 mg/dL  ACETAMINOPHEN LEVEL      Component Value Range   Acetaminophen (Tylenol), Serum <15.0  10 - 30 ug/mL  SALICYLATE LEVEL      Component Value Range   Salicylate Lvl <2.0 (*) 2.8 - 20.0 mg/dL  POCT I-STAT TROPONIN I      Component Value Range   Troponin i, poc 0.00  0.00 - 0.08 ng/mL   Comment 3            Dg Ribs Unilateral W/chest Right  10/30/2011  *RADIOLOGY REPORT*  Clinical Data: Chest and right rib pain.  RIGHT RIBS AND CHEST - 3+ VIEW  Comparison: 10/31/2007 chest radiograph  Findings: The cardiomediastinal silhouette is unremarkable. 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 There is no evidence of focal airspace disease, pulmonary edema, suspicious pulmonary nodule/mass, pleural effusion, or pneumothorax. No acute bony abnormalities are identified.  IMPRESSION: No evidence of acute abnormality.  No evidence of rib fracture.  CPT/emphysema.  Original Report Authenticated By: Rosendo Gros, M.D.   Ct Head Wo Contrast  10/30/2011  *RADIOLOGY REPORT*  Clinical Data:  66 year old male with syncope and fall with injury, headache, facial injury and neck pain.  CT HEAD WITHOUT CONTRAST CT MAXILLOFACIAL WITHOUT CONTRAST CT CERVICAL SPINE WITHOUT CONTRAST  Technique:   Multidetector CT imaging of the head, cervical spine, and maxillofacial structures were performed using the standard protocol without intravenous contrast. Multiplanar CT image reconstructions of the cervical spine and maxillofacial structures were also generated.  Comparison:  None  CT HEAD  Findings: No acute intracranial abnormalities are identified, including mass lesion or mass effect, hydrocephalus, extra-axial fluid collection, midline shift, hemorrhage, or acute infarction.  The visualized bony calvarium is unremarkable.  IMPRESSION: No evidence of acute intracranial abnormality.  CT MAXILLOFACIAL  Findings:  There is a nondisplaced fracture of the anterior nasal septum. No other fracture, subluxation or dislocation identified. The paranasal sinuses are clear. The orbits and globes are unremarkable. Cavities and periapical abscesses of the remaining two lower teeth noted.  IMPRESSION: Nondisplaced fracture of the anterior nasal septum.  Cavities and periapical abscesses of the remaining two lower teeth.  CT CERVICAL SPINE  Findings:   Normal alignment is noted. There is no evidence of fracture, subluxation or prevertebral soft tissue swelling. Multilevel degenerative disc disease, spondylosis and facet arthropathy are noted, greatest at C5-C6.  Moderate to severe central spinal narrowing is identified at C4-C5 and C5-C6. No focal bony lesions are present.  Emphysema in the lung apices noted.  IMPRESSION: No static evidence of acute injury to the cervical spine.  Multilevel degenerative changes.  Emphysema.  Original Report Authenticated By: Rosendo Gros, M.D.   Ct Cervical Spine Wo Contrast  10/30/2011  *RADIOLOGY REPORT*  Clinical Data:  66 year old male with syncope and fall with  injury, headache, facial injury and neck pain.  CT HEAD WITHOUT CONTRAST CT MAXILLOFACIAL WITHOUT CONTRAST CT CERVICAL SPINE WITHOUT CONTRAST  Technique:  Multidetector CT imaging of the head, cervical spine, and maxillofacial  structures were performed using the standard protocol without intravenous contrast. Multiplanar CT image reconstructions of the cervical spine and maxillofacial structures were also generated.  Comparison:  None  CT HEAD  Findings: No acute intracranial abnormalities are identified, including mass lesion or mass effect, hydrocephalus, extra-axial fluid collection, midline shift, hemorrhage, or acute infarction.  The visualized bony calvarium is unremarkable.  IMPRESSION: No evidence of acute intracranial abnormality.  CT MAXILLOFACIAL  Findings:  There is a nondisplaced fracture of the anterior nasal septum. No other fracture, subluxation or dislocation identified. The paranasal sinuses are clear. The orbits and globes are unremarkable. Cavities and periapical abscesses of the remaining two lower teeth noted.  IMPRESSION: Nondisplaced fracture of the anterior nasal septum.  Cavities and periapical abscesses of the remaining two lower teeth.  CT CERVICAL SPINE  Findings:   Normal alignment is noted. There is no evidence of fracture, subluxation or prevertebral soft tissue swelling. Multilevel degenerative disc disease, spondylosis and facet arthropathy are noted, greatest at C5-C6.  Moderate to severe central spinal narrowing is identified at C4-C5 and C5-C6. No focal bony lesions are present.  Emphysema in the lung apices noted.  IMPRESSION: No static evidence of acute injury to the cervical spine.  Multilevel degenerative changes.  Emphysema.  Original Report Authenticated By: Rosendo Gros, M.D.   Ct Maxillofacial Wo Cm  10/30/2011  *RADIOLOGY REPORT*  Clinical Data:  66 year old male with syncope and fall with injury, headache, facial injury and neck pain.  CT HEAD WITHOUT CONTRAST CT MAXILLOFACIAL WITHOUT CONTRAST CT CERVICAL SPINE WITHOUT CONTRAST  Technique:  Multidetector CT imaging of the head, cervical spine, and maxillofacial structures were performed using the standard protocol without intravenous  contrast. Multiplanar CT image reconstructions of the cervical spine and maxillofacial structures were also generated.  Comparison:  None  CT HEAD  Findings: No acute intracranial abnormalities are identified, including mass lesion or mass effect, hydrocephalus, extra-axial fluid collection, midline shift, hemorrhage, or acute infarction.  The visualized bony calvarium is unremarkable.  IMPRESSION: No evidence of acute intracranial abnormality.  CT MAXILLOFACIAL  Findings:  There is a nondisplaced fracture of the anterior nasal septum. No other fracture, subluxation or dislocation identified. The paranasal sinuses are clear. The orbits and globes are unremarkable. Cavities and periapical abscesses of the remaining two lower teeth noted.  IMPRESSION: Nondisplaced fracture of the anterior nasal septum.  Cavities and periapical abscesses of the remaining two lower teeth.  CT CERVICAL SPINE  Findings:   Normal alignment is noted. There is no evidence of fracture, subluxation or prevertebral soft tissue swelling. Multilevel degenerative disc disease, spondylosis and facet arthropathy are noted, greatest at C5-C6.  Moderate to severe central spinal narrowing is identified at C4-C5 and C5-C6. No focal bony lesions are present.  Emphysema in the lung apices noted.  IMPRESSION: No static evidence of acute injury to the cervical spine.  Multilevel degenerative changes.  Emphysema.  Original Report Authenticated By: Rosendo Gros, M.D.   1:33 PM  Lab tests and x-rays reviewed with pt and wife.  His maxillofacial CT showed a nasal fracture.  Chest and right rib x-rays were negative.  D-dimer was elevated; CT angio of chest ordered to check for pulmonary embolism.  Cardiac markers, BNP negative.     4:34 PM CT angio of  chest shows no PE but does show undisplaced fractures of the right 4th through 7th ribs.  Will ask [facility] to admit him for syncope, rib and nasal fractures.   1. Syncope   2. Rib  fractures   3. Nasal fracture             Carleene Cooper III, MD 10/30/11 [phone]

## 2011-10-30 NOTE — H&P (Signed)
History and Physical       Hospital Admission Note Date: 10/30/2011  Patient name: Chad [name] Medical record number: [id number] Date of birth: 1945/06/01 Age: 66 y.o. Gender: male PCP: [name], MD  Primary cardiologist: Dr. Verdis Prime with [facility] cardiology  Chief Complaint:  Sudden syncopal episode today  HPI: Patient is a 66 year old male with history of coronary artery disease with CAD and MI 4 years ago (per patient on this day), hypertension, hyperlipidemia, diabetes mellitus presented to ED with syncopal episode today. History was obtained from the patient and his family in the room. Patient's family, he has been complaining on feeling somewhat unwell, fatigued and sleepy in the last 3-4 days. His BP has been running on higher side and per patient recently lisinopril-HCTZ was added to his antihypertensives. Today patient was coming out of his bathroom when he felt dizzy and lightheaded, passed out and fell flat on his face. The whole episode was witnessed by his wife in the room. She denied him having any seizure activity. The patient then regained consciousness however was bleeding from his nose. Patient could not recall any events and did not know how long he had been out. He denied any chest pain or palpitations prior to the event. He denied any history of prior syncopal episodes. He also mentioned that he started taking aspirin recently despite the fact that he did not take it after the PCI as it caused GI upset. Patient's wife also reported that he has not been acting himself in the last few days, with forgetting things and off-and-on confusion. He denied any focal weakness, headache or slurred speech.  In the ED, extensive workup was done which showed nondisplaced fracture of the anterior nasal septum, no PE on the CT angiogram of the chest but did show nondisplaced fractures of the right fourth through seventh ribs. First  set of troponins negative. EKG did not show acute ST-T wave changes just about any ischemia or 2/3 AV block.  Review of Systems:  Constitutional: Denies fever, chills, diaphoresis, patient endorses feeling tired and sleepy in the last few days  HEENT: Denies photophobia, eye pain, redness, hearing loss, ear pain, congestion, sore throat, rhinorrhea, sneezing, mouth sores, trouble swallowing, neck pain, neck stiffness and tinnitus.   Respiratory: Denies SOB, DOE, cough, chest tightness,  and wheezing.   Cardiovascular: Denies chest pain, palpitations and leg swelling.  please see history of present illness Gastrointestinal: Denies nausea, vomiting, abdominal pain, diarrhea, constipation, blood in stool and abdominal distention.  Genitourinary: Denies dysuria, urgency, hematuria, flank pain and difficulty urinating.  patient mentions increased urination after starting lisinopril -HCTZ combination Musculoskeletal: Denies myalgias, back pain, joint swelling, arthralgias and gait problem.  Skin: Denies pallor, rash and wound.  Neurological: Please see history of present illness .  Hematological: Denies adenopathy. Easy bruising, personal or family bleeding history  Psychiatric/Behavioral: Denies suicidal ideation, mood changes, confusion, nervousness, sleep disturbance and agitation  Past Medical History: Past Medical History  Diagnosis Date  . Coronary artery disease   . Diabetes mellitus   . Hypertension    Past Surgical History  Procedure Date  . Coronary stent placement   . Transurethral resection of prostate     Medications: Prior to Admission medications   Medication Sig Start Date End Date Taking? Authorizing Provider  atorvastatin (LIPITOR) 40 MG tablet Take 40 mg by mouth daily.   Yes Historical Provider, MD  carvedilol (COREG) 25 MG tablet Take 25 mg by mouth 2 (two) times daily  with a meal.   Yes Historical Provider, MD  glipiZIDE (GLUCOTROL) 10 MG tablet Take 5 mg by mouth 2  (two) times daily before a meal.   Yes Historical Provider, MD  lisinopril (PRINIVIL,ZESTRIL) 20 MG tablet Take 20 mg by mouth daily.   Yes Historical Provider, MD  lisinopril-hydrochlorothiazide (PRINZIDE,ZESTORETIC) 20-25 MG per tablet Take 1 tablet by mouth daily.   Yes Historical Provider, MD  metFORMIN (GLUCOPHAGE) 1000 MG tablet Take 1,000 mg by mouth 2 (two) times daily with a meal.   Yes Historical Provider, MD  Multiple Vitamin (MULTIVITAMIN WITH MINERALS) TABS Take 1 tablet by mouth daily. [facility] Specialist   Yes Historical Provider, MD  nitroGLYCERIN (NITROSTAT) 0.4 MG SL tablet Place 0.4 mg under the tongue every 5 (five) minutes as needed.   Yes Historical Provider, MD    Allergies:  No Known Allergies  Social History:  does not have a smoking history on file. He does not have any smokeless tobacco history on file. His alcohol and drug histories not on file. he lives at home and is functional with all his ADLs  Family History: No family history on file.  Physical Exam: Blood pressure 140/112, pulse 70, temperature 97.9 F (36.6 C), temperature source Oral, resp. rate 18, SpO2 99.00%. General:  oriented x3, but dozing off during encounter, no acute distress. HEENT: anicteric sclera, pink conjunctiva, pupils equal and reactive to light and accomodation, pinpoint, swelling over the nose anteriorly, no active epistaxis, small cut on the upper lip, bluish bruising under the chin  Neck: supple, no masses or lymphadenopathy, no goiter, no bruits  Heart: Regular rate and rhythm, without murmurs, rubs or gallops. Chest/Lungs: Clear to auscultation bilaterally, no wheezing, rales or rhonchi. Chest wall tenderness on the right Abdomen: Soft, nontender, nondistended, positive bowel sounds, no masses. Extremities: No clubbing, cyanosis or edema with positive pedal pulses. Neuro: Grossly intact, no focal neurological deficits, strength 5/5 upper and lower extremities bilaterally Psych:  Sleepy, normal mood and affect Skin: no rashes or lesions, warm and dry   LABS on Admission:  Basic Metabolic Panel:  Lab 10/30/11 1610  NA 140  K 5.0  CL 104  CO2 27  GLUCOSE 246*  BUN 23  CREATININE 1.17  CALCIUM 9.2  MG --  PHOS --   Liver Function Tests:  Lab 10/30/11 1055  AST 44*  ALT 70*  ALKPHOS 79  BILITOT 0.5  PROT 6.7  ALBUMIN 3.9  CBC:  Lab 10/30/11 1055  WBC 8.3  NEUTROABS 6.1  HGB 15.3  HCT 43.3  MCV 89.3  PLT 176     Radiological Exams on Admission: Dg Ribs Unilateral W/chest Right  10/30/2011  *RADIOLOGY REPORT*  Clinical Data: Chest and right rib pain.  RIGHT RIBS AND CHEST - 3+ VIEW  Comparison: 10/31/2007 chest radiograph  Findings: The cardiomediastinal silhouette is unremarkable. 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 There is no evidence of focal airspace disease, pulmonary edema, suspicious pulmonary nodule/mass, pleural effusion, or pneumothorax. No acute bony abnormalities are identified.  IMPRESSION: No evidence of acute abnormality.  No evidence of rib fracture.  CPT/emphysema.  Original Report Authenticated By: Rosendo Gros, M.D.   Ct Head Wo Contrast  10/30/2011  CT HEAD  Findings: No acute intracranial abnormalities are identified, including mass lesion or mass effect, hydrocephalus, extra-axial fluid collection, midline shift, hemorrhage, or acute infarction.  The visualized bony calvarium is unremarkable.  IMPRESSION: No evidence of acute intracranial abnormality.    Original Report Authenticated By: JEFFREY T. HU,  M.D.   Ct Cervical Spine Wo Contrast  10/30/2011    IMPRESSION: No static evidence of acute injury to the cervical spine.  Multilevel degenerative changes.  Emphysema.  Original Report Authenticated By: Rosendo Gros, M.D.   Ct Maxillofacial Wo Cm  10/30/2011 CT MAXILLOFACIAL     IMPRESSION: Nondisplaced fracture of the anterior nasal septum.  Cavities and periapical abscesses of the remaining two lower teeth.   Original Report  Authenticated By: Rosendo Gros, M.D.    Assessment/Plan Present on Admission:   .Syncope and collapse: Unclear etiology, multiple differentials including cardiac, rule out any arrhythmias, medication effect from recent addition of lisinopril-HCTZ combination, rule out CVA, vasovagal. Patient did not have any seizure-like activity. PE ruled out from negative CT angiogram of the chest. - Admit to telemetry, obtain serial cardiac enzymes, TSH. Continue IV fluid hydration. - Obtain 2-D echocardiogram, carotid Dopplers, MRI of the brain for syncope workup - Cardiology consultation, discussed with Dr. Donnie Aho, will await further recommendations, may need Holter monitor if above workup negative.  .Nasal septum fracture: Nondisplaced, no septal hematoma on CT imaging  - Discussed in detail with Dr. Jenne Pane, [facility] on call, will see patient in the office in 3-5 days after the swelling is improved.    Marland KitchenCAD (coronary artery disease): No chest pain, first set cardiac enzymes negative, no acute EKG changes - Obtain 2-D echocardiogram. Per patient, had not been able to tolerate aspirin before, I will defer to cardiology for the decision.   Marland KitchenHTN (hypertension), malignant -- Continue beta blocker, lisinopril, PRN hydralazine for BP control. Hold HCTZ.   .Diabetes mellitus: - Obtain hemoglobin A1c, place on sliding scale insulin and glipizide  - Hold metformin in patient due to contrast given during imagings   .Hyperlipidemia: Obtain lipid panel, continue atorvastatin   .Rib fractures - Continue pain control, incentives prominent right  DVT prophylaxis: Place on bilateral SCDs. (Given patient was having epistaxis due to nasal septum fracture, hold off on Lovenox today)   CODE STATUS: Full code  Family communication: Discussed with the entire family at bedside, patient's wife Chad [name], phone #[phone]  Further plan will depend as patient's clinical course evolves and further radiologic and  laboratory data become available.   @Time  Spent on Admission: 1 hour  Nathin Saran M.D. [facility] 10/30/2011, 5:51 PM Pager: [phone]  If 7PM-7AM, please contact night-coverage [email or web address] Password [email or web address]

## 2011-10-30 NOTE — ED Notes (Signed)
MD at bedside.  Removed c-collar and spine board

## 2011-10-30 NOTE — Discharge Instructions (Signed)
Metformin and X-ray Contrast Studies For some X-ray exams, a contrast dye is used. Contrast dye is a type of medicine used to make the X-ray image clearer. The contrast dye is given to the patient through a vein (intravenously). If you need to have this type of X-ray exam and you take a medication called metformin, your caregiver may have you stop taking metformin before the exam.  LACTIC ACIDOSIS In rare cases, a serious medical condition called lactic acidosis can develop in people who take metformin and receive contrast dye. The following conditions can increase the risk of this complication:   Kidney failure.   Liver problems.   Certain types of heart problems such as:   Heart failure.   Heart attack.   Heart infection.   Heart valve problems.   Alcohol abuse.  If left untreated, lactic acidosis can lead to coma.  SYMPTOMS OF LACTIC ACIDOSIS Symptoms of lactic acidosis can include:  Rapid breathing (hyperventilation).   Neurologic symptoms such as:   Headaches.   Confusion.   Dizziness.   Excessive sweating.   Feeling sick to your stomach (nauseous) or throwing up (vomiting).  AFTER THE X-RAY EXAM  Stay well-hydrated. Drink fluids as instructed by your caregiver.   If you have a risk of developing lactic acidosis, blood tests may be done to make sure your kidney function is okay.   Metformin is usually stopped for 48 hours after the X-ray exam. Ask your caregiver when you can start taking metformin again.  SEEK MEDICAL CARE IF:   You have shortness of breath or difficulty breathing.   You develop a headache that does not go away.   You have nausea or vomiting.   You urinate more than normal.   You develop a skin rash and have:   Redness.   Swelling.   Itching.  Document Released: 04/13/2009 Document Revised: 04/14/2011 Document Reviewed: 04/13/2009 [employer] Patient Information 2012 [employer]. 

## 2011-10-31 ENCOUNTER — Observation Stay (HOSPITAL_COMMUNITY): Payer: Medicare Other

## 2011-10-31 DIAGNOSIS — S2239XA Fracture of one rib, unspecified side, initial encounter for closed fracture: Secondary | ICD-10-CM

## 2011-10-31 DIAGNOSIS — I519 Heart disease, unspecified: Secondary | ICD-10-CM

## 2011-10-31 DIAGNOSIS — R55 Syncope and collapse: Secondary | ICD-10-CM

## 2011-10-31 DIAGNOSIS — I10 Essential (primary) hypertension: Secondary | ICD-10-CM

## 2011-10-31 DIAGNOSIS — S022XXA Fracture of nasal bones, initial encounter for closed fracture: Secondary | ICD-10-CM

## 2011-10-31 LAB — BASIC METABOLIC PANEL
CO2: 26 mEq/L (ref 19–32)
Calcium: 9.3 mg/dL (ref 8.4–10.5)
Glucose, Bld: 185 mg/dL — ABNORMAL HIGH (ref 70–99)
Sodium: 139 mEq/L (ref 135–145)

## 2011-10-31 LAB — CARDIAC PANEL(CRET KIN+CKTOT+MB+TROPI)
Relative Index: 2.4 (ref 0.0–2.5)
Troponin I: <0.3 ng/mL (ref <0.30)
Troponin I: <0.3 ng/mL (ref <0.30)

## 2011-10-31 LAB — CBC
HCT: 41.4 % (ref 39.0–52.0)
Hemoglobin: 14.3 g/dL (ref 13.0–17.0)
MCH: 31.2 pg (ref 26.0–34.0)
MCV: 90.2 fL (ref 78.0–100.0)
RBC: 4.59 MIL/uL (ref 4.22–5.81)

## 2011-10-31 LAB — LIPID PANEL
Cholesterol: 105 mg/dL (ref 0–200)
HDL: 26 mg/dL — ABNORMAL LOW (ref >39)
Total CHOL/HDL Ratio: 4 RATIO
Triglycerides: 126 mg/dL (ref <150)

## 2011-10-31 LAB — GLUCOSE, CAPILLARY: Glucose-Capillary: 212 mg/dL — ABNORMAL HIGH (ref 70–99)

## 2011-10-31 LAB — CREATININE, SERUM
Creatinine, Ser: 1.09 mg/dL (ref 0.50–1.35)
GFR calc Af Amer: 80 mL/min — ABNORMAL LOW (ref >90)

## 2011-10-31 LAB — TSH: TSH: 0.828 u[IU]/mL (ref 0.350–4.500)

## 2011-10-31 MED ORDER — METHOCARBAMOL 750 MG PO TABS
750.0000 mg | ORAL_TABLET | Freq: Four times a day (QID) | ORAL | Status: DC
  Filled 2011-10-31 (×5): qty 1

## 2011-10-31 MED ORDER — LISINOPRIL 20 MG PO TABS: 40.0000 mg | ORAL_TABLET | Freq: Every day | ORAL | Status: DC

## 2011-10-31 MED ORDER — HYDROMORPHONE HCL PF 1 MG/ML IJ SOLN
1.0000 mg | Freq: Once | INTRAMUSCULAR | Status: AC
  Administered 2011-10-31: 1 mg via INTRAVENOUS
  Filled 2011-10-31: qty 1

## 2011-10-31 MED ORDER — METHOCARBAMOL 750 MG PO TABS: 750.0000 mg | ORAL_TABLET | Freq: Four times a day (QID) | ORAL | Status: AC

## 2011-10-31 MED ORDER — ACETAMINOPHEN-CODEINE #3 300-30 MG PO TABS: 1.0000 | ORAL_TABLET | Freq: Four times a day (QID) | ORAL | Status: AC | PRN

## 2011-10-31 MED ORDER — CARVEDILOL 25 MG PO TABS: 25.0000 mg | ORAL_TABLET | Freq: Two times a day (BID) | ORAL | Status: DC

## 2011-10-31 MED ORDER — LORAZEPAM 2 MG/ML IJ SOLN
1.0000 mg | Freq: Once | INTRAMUSCULAR | Status: AC
  Administered 2011-10-31: 12:00:00 via INTRAVENOUS
  Filled 2011-10-31: qty 1

## 2011-10-31 NOTE — Progress Notes (Signed)
Utilization review complete 

## 2011-10-31 NOTE — Evaluation (Signed)
Physical Therapy Evaluation Patient Details Name: Chad [name] MRN: [id number] DOB: Sep 06, 1945 Today's Date: 10/31/2011 Time: 4098-1191 PT Time Calculation (min): 27 min  PT Assessment / Plan / Recommendation Clinical Impression  Pt is a 66 y/o male who "passed out" and fell fracturing mulitiple ribs on his right.  Pt mildly unsteady with mobility.  May benefit from short term use of a cane for ambulation.  Pt may also benefit from 3 in 1 as standing from low surface is difficulty for him.      PT Assessment  Patent does not need any further PT services    Follow Up Recommendations  No PT follow up    Barriers to Discharge        lEquipment Recommendations  3 in 1 bedside comode    Recommendations for Other Services     Frequency      Precautions / Restrictions Precautions Precautions: Fall   Pertinent Vitals/Pain Pt c/o pain in ribs 7-8/10.  RN medicated pt during session.  BP in sitting = to BP in standing. 151/72      Mobility  Bed Mobility Bed Mobility: Left Sidelying to Sit;Sit to Sidelying Left;Rolling Left Rolling Left: 5: Supervision Left Sidelying to Sit: 5: Supervision Sit to Sidelying Left: 5: Supervision Details for Bed Mobility Assistance: Verbal cues for technique to minimize pain from right rib fractures.  Transfers Transfers: Sit to Stand;Stand to Sit Sit to Stand: 5: Supervision;From bed;From chair/3-in-1;4: Min assist;With upper extremity assist (min assist from low chair. ) Stand to Sit: 5: Supervision;To bed;To chair/3-in-1 Details for Transfer Assistance: Supervision for safety secondary to mild dizziness  Ambulation/Gait Ambulation/Gait Assistance: 5: Supervision Ambulation Distance (Feet): 150 Feet Assistive device: None Ambulation/Gait Assistance Details: Supervision for safety secondary to pt report of feeling a littile whoozy from pain meds.   Gait Pattern: Within Functional Limits Stairs: No Wheelchair Mobility Wheelchair Mobility: No      Exercises     PT Diagnosis:    PT Problem List:   PT Treatment Interventions:     PT Goals Acute Rehab PT Goals PT Goal Formulation: With patient/family  Visit Information  Last PT Received On: 10/31/11    Subjective Data  Subjective: Agree to PT eval  Patient Stated Goal: Return to home independent   Prior Functioning  Home Living Lives With: Spouse Available Help at Discharge: Family Type of Home: Apartment Home Access: Stairs to enter Secretary/administrator of Steps: 4 Entrance Stairs-Rails: None Home Layout: One level Bathroom Shower/Tub: Forensic scientist: Standard Bathroom Accessibility: Yes How Accessible: Accessible via walker Home Adaptive Equipment: None Prior Function Level of Independence: Independent Able to Take Stairs?: Yes Driving: Yes Vocation: Retired Musician: No difficulties Dominant Hand: Right    Cognition  Overall Cognitive Status: Appears within functional limits for tasks assessed/performed Arousal/Alertness: Awake/alert Orientation Level: Appears intact for tasks assessed Behavior During Session: [facility] for tasks performed    Extremity/Trunk Assessment Right Upper Extremity Assessment RUE ROM/Strength/Tone: Unable to fully assess;Due to pain Left Upper Extremity Assessment LUE ROM/Strength/Tone: Unable to fully assess;Due to pain Right Lower Extremity Assessment RLE ROM/Strength/Tone: Within functional levels Left Lower Extremity Assessment LLE ROM/Strength/Tone: Within functional levels   Balance Balance Balance Assessed: Yes Static Sitting Balance Static Sitting - Balance Support: Feet supported;No upper extremity supported Static Sitting - Level of Assistance: 7: Independent Static Sitting - Comment/# of Minutes: 3+ minutes sitting on EOB with no LOB. Static Standing Balance Static Standing - Balance Support: No upper extremity supported Static  Standing - Level of Assistance: 5: Stand  by assistance  End of Session PT - End of Session Activity Tolerance: Patient tolerated treatment well;Patient limited by pain Patient left: in chair;with call bell/phone within reach;with family/visitor present Nurse Communication: Mobility status   Rheannon Cerney 10/31/2011, 1:13 PM  Romanda Turrubiates L. Rossana Molchan DPT [phone]

## 2011-10-31 NOTE — Progress Notes (Signed)
Nursing Note: Pt to be discharged per MD's order. IV and cardiac monitor discontinued per MD's orders. Pt's family and patient received all discharge information and verbalized understanding. Will escort pt to car safely. Cashius Grandstaff Scientist, clinical (histocompatibility and immunogenetics).

## 2011-10-31 NOTE — Progress Notes (Signed)
Orthostatic VS: lying:147/81 HR 84                          Sitting: 128/72 HR 89                          Standing: 126/69 HR 91                          Standing 3 min: 136/71 HR 90 Thanks, MIke F RN

## 2011-10-31 NOTE — Discharge Summary (Signed)
Physician Discharge Summary  Patient ID: Chad [name] MRN: [id number] DOB/AGE: 67-Sep-1947 67 y.o.  Admit date: 10/30/2011 Discharge date: 10/31/2011  Primary Care Physician:  [name], MD  Discharge Diagnoses:    .Syncope and collapse vasovagal versus neurally mediated syncope secondary to micturition  .Nasal septum fracture, nondisplaced  .CAD (coronary artery disease) .Type 2 diabetes mellitus with vascular disease .Rib fractures, right 4-7  Consults:  1) cardiology- Dr. Verdis Prime                     2) Dr. Jenne Pane, [facility] ENT via phone consultation   Discharge Medications: Medication List  As of 10/31/2011 10:22 AM   STOP taking these medications         lisinopril-hydrochlorothiazide 20-25 MG per tablet         TAKE these medications         acetaminophen-codeine 300-30 MG per tablet   Commonly known as: TYLENOL #3   Take 1 tablet by mouth every 6 (six) hours as needed for pain (can take every 4-6 hours PRN for pain).      atorvastatin 40 MG tablet   Commonly known as: LIPITOR   Take 40 mg by mouth daily.      carvedilol 25 MG tablet   Commonly known as: COREG   Take 1 tablet (25 mg total) by mouth 2 (two) times daily with a meal.      glipiZIDE 10 MG tablet   Commonly known as: GLUCOTROL   Take 5 mg by mouth 2 (two) times daily before a meal.      lisinopril 20 MG tablet   Commonly known as: PRINIVIL,ZESTRIL   Take 2 tablets (40 mg total) by mouth daily.      metFORMIN 1000 MG tablet   Commonly known as: GLUCOPHAGE   Take 1,000 mg by mouth 2 (two) times daily with a meal.      methocarbamol 750 MG tablet   Commonly known as: ROBAXIN   Take 1 tablet (750 mg total) by mouth 4 (four) times daily.      multivitamin with minerals Tabs   Take 1 tablet by mouth daily. [employer] Specialist      nitroGLYCERIN 0.4 MG SL tablet   Commonly known as: NITROSTAT   Place 0.4 mg under the tongue every 5 (five) minutes as needed.             Brief H  and P: For complete details please refer to admission H and P, but in brief Patient is a 66 year old male with history of coronary artery disease with CAD and MI 4 years ago (per patient on this day), hypertension, hyperlipidemia, diabetes mellitus presented to ED with syncopal episode today. History was obtained from the patient and his family in the room. Patient's family, he has been complaining on feeling somewhat unwell, fatigued and sleepy in the last 3-4 days. His BP has been running on higher side and per patient recently lisinopril-HCTZ was added to his antihypertensives. Today patient was coming out of his bathroom when he felt dizzy and lightheaded, passed out and fell flat on his face. The whole episode was witnessed by his wife in the room. She denied him having any seizure activity. The patient then regained consciousness however was bleeding from his nose. Patient could not recall any events and did not know how long he had been out. He denied any chest pain or palpitations prior to the event. He denied any history of  prior syncopal episodes. He also mentioned that he started taking aspirin recently despite the fact that he did not take it after the PCI as it caused GI upset. Patient's wife also reported that he has not been acting himself in the last few days, with forgetting things and off-and-on confusion. He denied any focal weakness, headache or slurred speech.  In the ED, extensive workup was done which showed nondisplaced fracture of the anterior nasal septum, no PE on the CT angiogram of the chest but did show nondisplaced fractures of the right fourth through seventh ribs. First set of troponins negative. EKG did not show acute ST-T wave changes just about any ischemia or 2/3 AV block.  Hospital Course:   .Syncope and collapse: Possibly vasovagal versus neurally mediated micturition syncope versus medication effect from recently started lisinopril-HCTZ combination. Patient was admitted  for observation. Patient did not have any seizure-like activity. PE ruled out from negative CT angiogram of the chest. Patient was admitted to telemetry monitored floor, had no significant arrhythmias. Serial cardiac enzymes were negative for any acute ACS. Patient was placed on gentle IV hydration, lisinopril HCTZ combination was discontinued. 2-D echocardiogram was obtained which showed preserved EF, carotid Dopplers showed no critical ICA stenosis. MRI of the brain was obtained which showed mild atrophy but no acute CVA. Patient was  ambulating in the hallway without any difficulty.    .Nasal septum fracture: Nondisplaced, no septal hematoma on CT imaging. I discussed in detail with Dr. Jenne Pane, [facility] ENT on call, will see patient in the office in 3-5 days after the swelling is improved. This was explained to the patient and his wife.   Marland KitchenCAD (coronary artery disease): cardiac enzymes remain negative, 2-D echo showed preserved EF of 60%.   Marland KitchenHTN (hypertension), malignant: Patient's BP was noted to be somewhat uncontrolled, patient was continued on beta blocker but dose of Coreg was increased to 25 mg BID, lisinopril was increased to 40 mg daily. Lisinopril- HCTZ combination was discontinued.--   .Diabetes mellitus: Patient was placed on sliding scale insulin while inpatient.  .Hyperlipidemia: Obtain lipid panel, continue atorvastatin   .Rib fractures : Patient had right 4-7 with fractures, he was provided with pain control, incentive spirometry   Day of Discharge BP 134/74  Pulse 84  Temp 98.3 F (36.8 C) (Oral)  Resp 18  Ht 6\' 1"  (1.854 m)  Wt 86.2 kg (190 lb 0.6 oz)  BMI 25.07 kg/m2  SpO2 94%  Physical Exam: General: Alert and awake oriented x3 not in any acute distress. HEENT: anicteric sclera, pupils reactive to light and accommodation, swelling anteriorly on the nose  CVS: S1-S2 clear no murmur rubs or gallops Chest: clear to auscultation bilaterally, no wheezing rales or rhonchi,  chest wall tenderness on the right  Abdomen: soft nontender, nondistended, normal bowel sounds, no organomegaly Extremities: no cyanosis, clubbing or edema noted bilaterally Neuro: Cranial nerves II-XII intact, no focal neurological deficits   The results of significant diagnostics from this hospitalization (including imaging, microbiology, ancillary and laboratory) are listed below for reference.    LAB RESULTS: Basic Metabolic Panel:  Lab 10/31/11 9147 10/30/11 1900 10/30/11 1055  NA 139 -- 140  K 4.0 -- 5.0  CL 102 -- 104  CO2 26 -- 27  GLUCOSE 185* -- 246*  BUN 24* -- 23  CREATININE 1.06 1.09 --  CALCIUM 9.3 -- 9.2  MG -- -- --  PHOS -- -- --   Liver Function Tests:  Lab 10/30/11 1055  AST  44*  ALT 70*  ALKPHOS 79  BILITOT 0.5  PROT 6.7  ALBUMIN 3.9   CBC:  Lab 10/31/11 0639 10/30/11 1055  WBC 11.0* 8.3  NEUTROABS -- 6.1  HGB 14.3 15.3  HCT 41.4 43.3  MCV 90.2 --  PLT 182 176   Cardiac Enzymes:  Lab 10/31/11 0639 10/31/11 0015  CKTOTAL 134 123  CKMB 2.9 3.0  CKMBINDEX -- --  TROPONINI <0.30 <0.30   BNP: No components found with this basename: POCBNP:2 CBG:  Lab 10/31/11 0606 10/30/11 2350  GLUCAP 175* 212*    Significant Diagnostic Studies:   Dg Ribs Unilateral W/chest Right  10/30/2011 IMPRESSION: No evidence of acute abnormality. No evidence of rib fracture. CPT/emphysema. Original Report Authenticated By: Rosendo Gros, M.D.   Ct Head Wo Contrast  10/30/2011 CT HEAD Findings: No acute intracranial abnormalities are identified, including mass lesion or mass effect, hydrocephalus, extra-axial fluid collection, midline shift, hemorrhage, or acute infarction. The visualized bony calvarium is unremarkable. IMPRESSION: No evidence of acute intracranial abnormality. Original Report Authenticated By: Rosendo Gros, M.D.   Ct Cervical Spine Wo Contrast  10/30/2011 IMPRESSION: No static evidence of acute injury to the cervical spine. Multilevel  degenerative changes. Emphysema. Original Report Authenticated By: Rosendo Gros, M.D.   Ct Maxillofacial Wo Cm  10/30/2011 CT MAXILLOFACIAL  IMPRESSION: Nondisplaced fracture of the anterior nasal septum. Cavities and periapical abscesses of the remaining two lower teeth. Original Report Authenticated By: Rosendo Gros, M.D.   MRI brain without contrast on 10/31/2011 IMPRESSION:  Mild atrophy. No acute intracranial findings. No acute stroke or hemorrhage  2-D echocardiogram 10/31/2011 Left ventricle: The cavity size was normal. Wall thickness was normal. Systolic function was normal. The estimated ejection fraction was in the range of 55% to 60%. Regional wall motion abnormalities cannot be excluded. Doppler parameters are consistent with abnormal left ventricular relaxation (grade 1 diastolic dysfunction).   Carotid Vascular Ultrasound 10/31/2011   There is no evidence of internal carotid artery stenosis bilaterally. Bilateral antegrade vertebral artery flow.      Disposition and Follow-up: Discharge Orders    Future Orders Please Complete By Expires   Diet Carb Modified      Increase activity slowly      Discharge instructions      Comments:   1) Please hold metformin today (due to contrast given for the imagings studies)  2) Your BP meds = Coreg is increased to 25mg  twice/day, Lisinopril is increased to 40mg /daily. 3) Please  STOP lisinopril-HCTZ combination med.       DISPOSITION: Home   DIET: Carb modified diet   ACTIVITY: As tolerated   DISCHARGE FOLLOW-UP Follow-up Information    Follow up with [name], MD. Schedule an appointment as soon as possible for a visit in 10 days. (for hospital follow-up)    Contact information:   [address] [place] [address] [phone]       Follow up with Lesleigh Noe, MD. Schedule an appointment as soon as possible for a visit in 10 days. (for follow-up)    Contact information:   [address] [place] [address] [place] [address] [phone]       Follow up with Christia Reading, MD. Schedule an appointment as soon as possible for a visit in 5 days. (please make the appt in 3-5 days, preferable end of this week for your nasal fracture  Thurs-Fri)    Contact information:   [facility] [address] [place] [address] [phone]          Time spent on Discharge: 45 mins  Signed:   Patrick Salemi M.D. [facility] 10/31/2011, 10:22 AM Pager: [phone]  If 7PM-7AM, please contact night-coverage [email or web address] Password [email or web address]

## 2011-10-31 NOTE — Progress Notes (Signed)
Patient Name: Chad [name] Date of Encounter: 10/31/2011    SUBJECTIVE: Fainted after urinating and sustained chest wall and nose injury. He says the faint was preceded by queezy/nausea feeling and wife states skin was clammy. No cardiac complaints before of after the episode.  TELEMETRY:  NSR Filed Vitals:   10/30/11 2329 10/30/11 2332 10/31/11 0152 10/31/11 0455  BP: 126/69 136/71 132/78 134/74  Pulse: 91 90 84 84  Temp:   98.2 F (36.8 C) 98.3 F (36.8 C)  TempSrc:   Oral Oral  Resp:   18 18  Height:      Weight:    86.2 kg (190 lb 0.6 oz)  SpO2:   96% 94%    Intake/Output Summary (Last 24 hours) at 10/31/11 0939 Last data filed at 10/31/11 0807  Gross per 24 hour  Intake    240 ml  Output      0 ml  Net    240 ml    LABS: Basic Metabolic Panel:  Basename 10/31/11 0639 10/30/11 1900 10/30/11 1055  NA 139 -- 140  K 4.0 -- 5.0  CL 102 -- 104  CO2 26 -- 27  GLUCOSE 185* -- 246*  BUN 24* -- 23  CREATININE 1.06 1.09 --  CALCIUM 9.3 -- 9.2  MG -- -- --  PHOS -- -- --   CBC:  Basename 10/31/11 0639 10/30/11 1055  WBC 11.0* 8.3  NEUTROABS -- 6.1  HGB 14.3 15.3  HCT 41.4 43.3  MCV 90.2 89.3  PLT 182 176   Cardiac Enzymes:  Basename 10/31/11 0639 10/31/11 0015 10/30/11 1845  CKTOTAL 134 123 118  CKMB 2.9 3.0 2.9  CKMBINDEX -- -- --  TROPONINI <0.30 <0.30 <0.30  Fasting Lipid Panel:  Basename 10/31/11 0639  CHOL 105  HDL 26*  LDLCALC 54  TRIG 956  CHOLHDL 4.0  LDLDIRECT --     Physical Exam: Blood pressure 134/74, pulse 84, temperature 98.3 F (36.8 C), temperature source Oral, resp. rate 18, height 6\' 1"  (1.854 m), weight 86.2 kg (190 lb 0.6 oz), SpO2 94.00%. Weight change:    No murmur or gallop.  ASSESSMENT:  1. Micturition syncope which has a neurally mediated mechanism. 2. No evidence MI or arrhythmia   Plan:  1. No ischemic workup needed. 2. Telemetry 3. May decide to remove Hctz from protocol.  Selinda Eon 10/31/2011, 9:39 AM

## 2011-10-31 NOTE — Progress Notes (Signed)
Nursing Note: Pt given Incentive Spirometry. Pt has been educated and verbalizes understanding.  Will continue to monitor pt. Cosimo Schertzer Scientist, clinical (histocompatibility and immunogenetics).

## 2011-10-31 NOTE — Progress Notes (Signed)
*  PRELIMINARY RESULTS* Vascular Ultrasound Carotid Duplex (Doppler) has been completed.   There is no evidence of internal carotid artery stenosis bilaterally. Bilateral antegrade vertebral artery flow.  10/31/2011 11:01 AM  Elpidio Galea, RDMS,RDCS

## 2011-10-31 NOTE — Progress Notes (Signed)
  Echocardiogram 2D Echocardiogram has been performed.  Chad [name] 10/31/2011, 11:29 AM

## 2011-11-01 LAB — URINE CULTURE

## 2012-08-17 IMAGING — CT CT HEAD W/O CM
2 of 10 series · 4 of 30 positions shown, 5 images · non-contrast
Comparison: None

CT HEAD

CLINICAL DATA: 65-year-old male with syncope and fall with injury,
headache, facial injury and neck pain.

CT HEAD WITHOUT CONTRAST
CT MAXILLOFACIAL WITHOUT CONTRAST
CT CERVICAL SPINE WITHOUT CONTRAST
TECHNIQUE: Multidetector CT imaging of the head, cervical spine,
and maxillofacial structures were performed using the standard
protocol without intravenous contrast. Multiplanar CT image
reconstructions of the cervical spine and maxillofacial structures
were also generated.

[Series 3: recon 2: brain · axial · 0.47mm/px · z∈[-67,-12]mm · 2 of 64 slices shown, 3 images]
[im 22/64  brain]
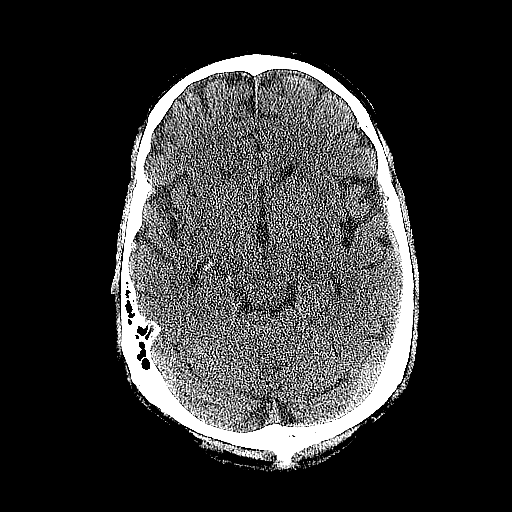
[im 22/64  bone]
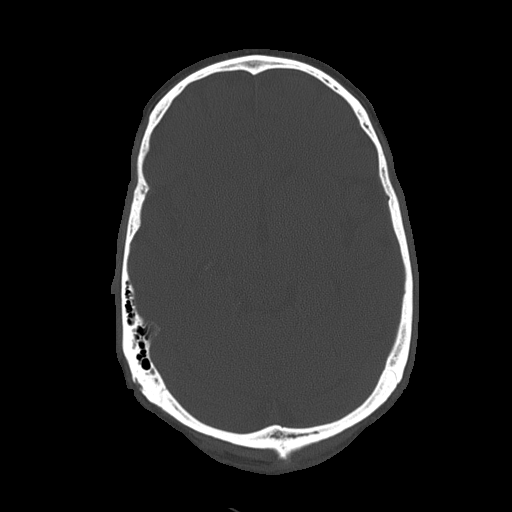
[im 43/64  brain]
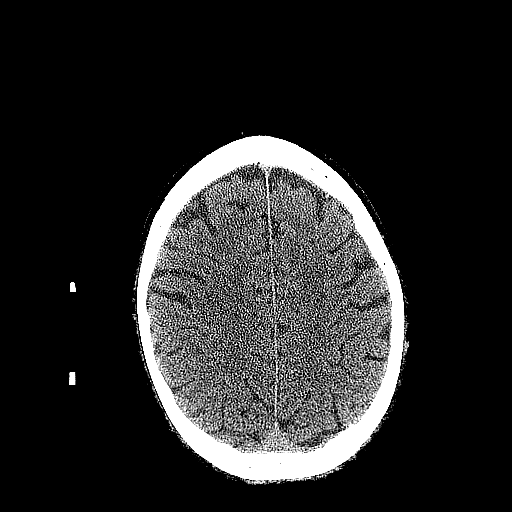

[Series 902: orthogonals · axial · 0.25mm/px · z∈[-273,-221]mm · 2 of 91 slices shown]
[im 31/91  brain]
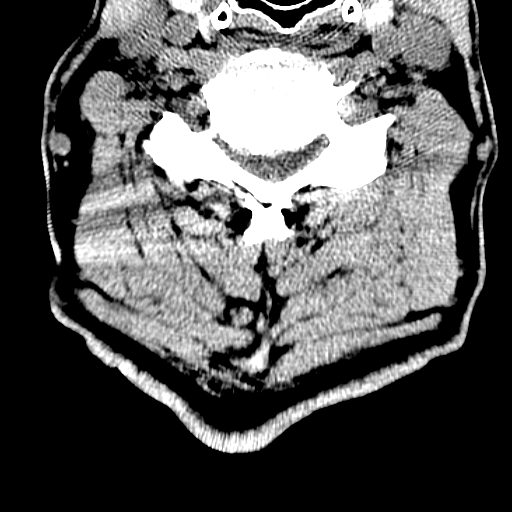
[im 61/91  brain]
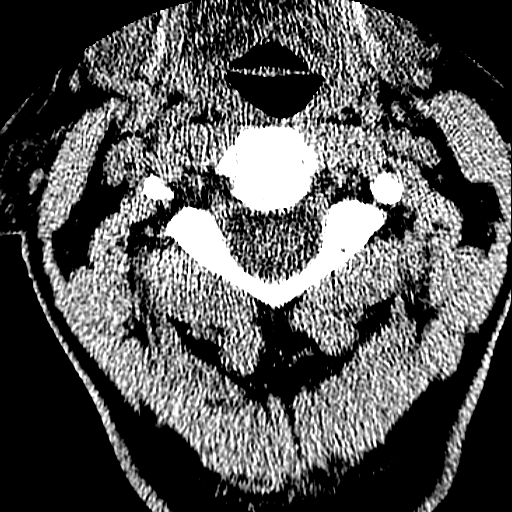

[4 of 30 positions shown; findings below may reference images not displayed]

FINDINGS: No acute intracranial abnormalities are identified,
including mass lesion or mass effect, hydrocephalus, extra-axial
fluid collection, midline shift, hemorrhage, or acute infarction.

The visualized bony calvarium is unremarkable.
IMPRESSION: No evidence of acute intracranial abnormality.

CT MAXILLOFACIAL
FINDINGS: There is a nondisplaced fracture of the anterior nasal
septum.
No other fracture, subluxation or dislocation identified.
The paranasal sinuses are clear.
The orbits and globes are unremarkable.
Cavities and periapical abscesses of the remaining two lower teeth
noted.
IMPRESSION: Nondisplaced fracture of the anterior nasal septum.

Cavities and periapical abscesses of the remaining two lower teeth.

CT CERVICAL SPINE
FINDINGS: Normal alignment is noted.
There is no evidence of fracture, subluxation or prevertebral soft
tissue swelling.
Multilevel degenerative disc disease, spondylosis and facet
arthropathy are noted, greatest at C5-C6.  Moderate to severe
central spinal narrowing is identified at C4-C5 and C5-C6.
No focal bony lesions are present.

Emphysema in the lung apices noted.
IMPRESSION: No static evidence of acute injury to the cervical spine.

Multilevel degenerative changes.

Emphysema.

## 2013-06-24 ENCOUNTER — Telehealth: Payer: Self-pay | Admitting: Interventional Cardiology

## 2013-06-24 NOTE — Telephone Encounter (Signed)
New message     Diabetic doctor is not in insurance network.  He want to know if Dr Tamala Julian will call in his diabetic medications---metformin and glitizide.   [facility]  I told pt Dr Tamala Julian does not do that but he wanted me to ask anyway.  I also told pt we were closing at noon and closed on tues----he said OK to call end of week

## 2013-06-26 NOTE — Telephone Encounter (Signed)
This needs to be taken care of by PCP

## 2013-06-27 NOTE — Telephone Encounter (Signed)
lmom per Dr.Smith pt diabetic medication needs to be refilled by pt pcp

## 2013-09-09 ENCOUNTER — Telehealth: Payer: Self-pay | Admitting: Interventional Cardiology

## 2013-09-09 MED ORDER — CARVEDILOL 25 MG PO TABS: 25.0000 mg | ORAL_TABLET | Freq: Two times a day (BID) | ORAL | Status: DC

## 2013-09-09 MED ORDER — LISINOPRIL 40 MG PO TABS: 40.0000 mg | ORAL_TABLET | Freq: Every day | ORAL | Status: DC

## 2013-09-09 MED ORDER — ATORVASTATIN CALCIUM 40 MG PO TABS: 40.0000 mg | ORAL_TABLET | Freq: Every day | ORAL | Status: DC

## 2013-09-09 NOTE — Telephone Encounter (Signed)
New message     Refill generic lipitor, lisinopril and carvedilol[facility].  Patient has not seen Dr Tamala Julian at new office.  He is due to come in June.

## 2013-10-08 ENCOUNTER — Encounter: Payer: Self-pay | Admitting: Interventional Cardiology

## 2013-10-09 ENCOUNTER — Ambulatory Visit: Payer: Medicare Other | Admitting: Interventional Cardiology

## 2013-10-16 ENCOUNTER — Telehealth: Payer: Self-pay | Admitting: *Deleted

## 2013-10-16 NOTE — Telephone Encounter (Signed)
Ok to send 90 day refill for carvedilol[facility] l[facility]opril, and [facility]atin [facility]? Please advise. Thanks, MI

## 2013-10-17 ENCOUNTER — Other Ambulatory Visit: Payer: Self-pay | Admitting: *Deleted

## 2013-10-17 MED ORDER — ATORVASTATIN CALCIUM 40 MG PO TABS: 40.0000 mg | ORAL_TABLET | Freq: Every day | ORAL | Status: DC

## 2013-10-17 MED ORDER — LISINOPRIL 40 MG PO TABS: 40.0000 mg | ORAL_TABLET | Freq: Every day | ORAL | Status: AC

## 2013-10-17 MED ORDER — CARVEDILOL 25 MG PO TABS: 25.0000 mg | ORAL_TABLET | Freq: Two times a day (BID) | ORAL | Status: DC

## 2013-10-24 ENCOUNTER — Ambulatory Visit: Payer: Medicare Other | Admitting: Interventional Cardiology

## 2013-11-26 ENCOUNTER — Ambulatory Visit: Payer: Medicare Other | Admitting: Interventional Cardiology

## 2014-01-01 ENCOUNTER — Ambulatory Visit (INDEPENDENT_AMBULATORY_CARE_PROVIDER_SITE_OTHER): Payer: Managed Care, Other (non HMO) | Admitting: Interventional Cardiology

## 2014-01-01 ENCOUNTER — Encounter: Payer: Self-pay | Admitting: Interventional Cardiology

## 2014-01-01 VITALS — BP 170/100 | HR 74 | Ht 73.0 in | Wt 182.8 lb

## 2014-01-01 DIAGNOSIS — E785 Hyperlipidemia, unspecified: Secondary | ICD-10-CM

## 2014-01-01 DIAGNOSIS — I10 Essential (primary) hypertension: Secondary | ICD-10-CM

## 2014-01-01 DIAGNOSIS — I2510 Atherosclerotic heart disease of native coronary artery without angina pectoris: Secondary | ICD-10-CM

## 2014-01-01 DIAGNOSIS — E1159 Type 2 diabetes mellitus with other circulatory complications: Secondary | ICD-10-CM

## 2014-01-01 MED ORDER — HYDROCHLOROTHIAZIDE 12.5 MG PO TABS: 12.5000 mg | ORAL_TABLET | Freq: Every day | ORAL | Status: DC

## 2014-01-01 NOTE — Progress Notes (Signed)
Patient ID: Chad [name], male   DOB: [birth year], 68 y.o.   MRN: [id number] Past Medical History  Coronary artery disease (Dr. Tamala Julian) - s/p MI 6/09; s/p RCA bare metal stent; Cardiolite 10/09-EF 51%   Type 2 diabetes   Hypertension   Hyperlipidemia - target LDL < 70   Adenomatous colon polyps   Kidney stones      [address], [address], [place]  [address] Phone: [phone] Fax:  [phone]  Date:  01/01/2014   ID:  Chad [name], DOB 12-31-1945, MRN [id number]  PCP:  Orpah Melter, MD   ASSESSMENT:  1. Coronary atherosclerosis with prior RCA bare-metal stent 2009 2. Hyperlipidemia 3. Hypertension 4. Diabetes mellitus  PLAN:  1. Stress cigarette smoking cessation. 2. Stress Cardiolite 3. A followup in one year nuclear study is unremarkable   SUBJECTIVE: Chad [name] is a 68 y.o. male who is doing relatively well but having exertional dyspnea with some limitation in exertional tolerance. He denies orthopnea, PND, syncope, and palpitations.   Wt Readings from Last 3 Encounters:  01/01/14 182 lb 12.8 oz (82.918 kg)  10/31/11 190 lb 0.6 oz (86.2 kg)     Past Medical History  Diagnosis Date  . Coronary artery disease     s/p MI 10/2007; s/p RCA BMS; Cardiolite 02/2008=EF 51%  . Type II or unspecified type diabetes mellitus without mention of complication, uncontrolled     diagnosed age 70  . Benign hypertension   . Hyperlipidemia   . BPH (benign prostatic hyperplasia)   . Inferior MI 2009  . Coronary atherosclerosis of native coronary artery   . Hyperlipidemia   . Pure hypercholesterolemia   . Adenomatous colon polyp   . Kidney stones   . Smoker   . Syncopal episodes     Only 1 episode because of hypotension    Current Outpatient Prescriptions  Medication Sig Dispense Refill  . atorvastatin (LIPITOR) 40 MG tablet Take 1 tablet (40 mg total) by mouth daily.  90 tablet  0  . carvedilol (COREG) 25 MG tablet Take 1 tablet (25 mg total) by  mouth 2 (two) times daily with a meal.  180 tablet  0  . glipiZIDE (GLUCOTROL) 10 MG tablet Take 5 mg by mouth 2 (two) times daily before a meal.      . hydrochlorothiazide (HYDRODIURIL) 12.5 MG tablet Take 12.5 mg by mouth daily.      Marland Kitchen lisinopril (PRINIVIL,ZESTRIL) 40 MG tablet Take 1 tablet (40 mg total) by mouth daily.  90 tablet  0  . metFORMIN (GLUCOPHAGE) 1000 MG tablet Take 1,000 mg by mouth 2 (two) times daily with a meal.      . metFORMIN (GLUCOPHAGE) 1000 MG tablet Take 1,000 mg by mouth 2 (two) times daily with a meal.      . Multiple Vitamin (MULTIVITAMIN WITH MINERALS) TABS Take 1 tablet by mouth daily. [facility] Specialist      . nitroGLYCERIN (NITROSTAT) 0.4 MG SL tablet Place 0.4 mg under the tongue every 5 (five) minutes as needed.       No current facility-administered medications for this visit.    Allergies:    Allergies  Allergen Reactions  . Lisinopril-Hydrochlorothiazide Other (See Comments)    Syncope, side effects    Social History:  The patient  reports that he has been smoking Cigarettes.  He has a 25 pack-year smoking history. He has never used smokeless tobacco. He reports that he does not drink alcohol or use  illicit drugs.   ROS:  Please see the history of present illness.   No neurological complaints. No claudication.   All other systems reviewed and negative.   OBJECTIVE: VS:  BP 170/100  Pulse 74  Ht 6\' 1"  (1.854 m)  Wt 182 lb 12.8 oz (82.918 kg)  BMI 24.12 kg/m2 Well nourished, well developed, in no acute distress, appears older than stated age 17: normal Neck: JVD flat. Carotid bruit absent  Cardiac:  normal S1, S2; RRR; no murmur Lungs:  clear to auscultation bilaterally, no wheezing, rhonchi or rales Abd: soft, nontender, no hepatomegaly Ext: Edema absent. Pulses 2+ and symmetric Skin: warm and dry Neuro:  CNs 2-12 intact, no focal abnormalities noted  EKG:  Paul with progression V1 through V4. Otherwise unremarkable        Signed, Illene Labrador III, MD 01/01/2014 3:01 PM

## 2014-01-01 NOTE — Patient Instructions (Signed)
Your physician recommends that you continue on your current medications as directed. Please refer to the Current Medication list given to you today.  Your physician has requested that you have en exercise stress myoview. For further information please visit [email or web address]. Please follow instruction sheet, as given.  Your physician wants you to follow-up in: 1 year with Dr.Smith You will receive a reminder letter in the mail two months in advance. If you don't receive a letter, please call our office to schedule the follow-up appointment.  

## 2014-01-11 ENCOUNTER — Encounter: Payer: Self-pay | Admitting: *Deleted

## 2014-01-14 ENCOUNTER — Ambulatory Visit (HOSPITAL_COMMUNITY): Payer: Medicare HMO | Attending: Cardiology | Admitting: Radiology

## 2014-01-14 VITALS — BP 157/104 | Ht 73.0 in | Wt 180.0 lb

## 2014-01-14 DIAGNOSIS — E119 Type 2 diabetes mellitus without complications: Secondary | ICD-10-CM | POA: Insufficient documentation

## 2014-01-14 DIAGNOSIS — I10 Essential (primary) hypertension: Secondary | ICD-10-CM | POA: Insufficient documentation

## 2014-01-14 DIAGNOSIS — E785 Hyperlipidemia, unspecified: Secondary | ICD-10-CM | POA: Diagnosis not present

## 2014-01-14 DIAGNOSIS — R55 Syncope and collapse: Secondary | ICD-10-CM | POA: Diagnosis not present

## 2014-01-14 DIAGNOSIS — R9439 Abnormal result of other cardiovascular function study: Secondary | ICD-10-CM | POA: Diagnosis not present

## 2014-01-14 DIAGNOSIS — I2510 Atherosclerotic heart disease of native coronary artery without angina pectoris: Secondary | ICD-10-CM | POA: Diagnosis not present

## 2014-01-14 DIAGNOSIS — R0602 Shortness of breath: Secondary | ICD-10-CM | POA: Insufficient documentation

## 2014-01-14 MED ORDER — TECHNETIUM TC 99M SESTAMIBI GENERIC - CARDIOLITE
11.0000 | Freq: Once | INTRAVENOUS | Status: AC | PRN
  Administered 2014-01-14: 11 via INTRAVENOUS

## 2014-01-14 MED ORDER — TECHNETIUM TC 99M SESTAMIBI GENERIC - CARDIOLITE
33.0000 | Freq: Once | INTRAVENOUS | Status: AC | PRN
  Administered 2014-01-14: 33 via INTRAVENOUS

## 2014-01-14 MED ORDER — REGADENOSON 0.4 MG/5ML IV SOLN
0.4000 mg | Freq: Once | INTRAVENOUS | Status: AC
  Administered 2014-01-14: 0.4 mg via INTRAVENOUS

## 2014-01-14 NOTE — Progress Notes (Signed)
[facility] [address] [place], [place] [address] [phone]    Cardiology Nuclear Med Study  Chad [name] is a 68 y.o. male     MRN : [id number]     DOB: 27-Nov-1945  Procedure Date: 01/14/2014  Nuclear Med Background Indication for Stress Test:  Evaluation for Ischemia History:  '09 MPI: Scar EF: 51% CAD Cardiac Risk Factors: Hypertension, Lipids and NIDDM  Symptoms:  Palpitations, SOB and Syncope   Nuclear Pre-Procedure Caffeine/Decaff Intake:  None NPO After: 7:00pm   Lungs:  clear O2 Sat: 96% on room air. IV 0.9% NS with Angio Cath:  20g  IV Site: R Hand  IV Started by:  Matilde Haymaker, RN  Chest Size (in):  42 Cup Size: n/a  Height: 6\' 1"  (1.854 m)  Weight:  180 lb (81.647 kg)  BMI:  Body mass index is 23.75 kg/(m^2). Tech Comments:  No Coreg x 18 hrs    Nuclear Med Study 1 or 2 day study: 1 day  Stress Test Type:  Lexiscan  Reading MD: n/a  Order Authorizing Provider:  Wyvonnia Lora  Resting Radionuclide: Technetium 71m Sestamibi  Resting Radionuclide Dose: 11.0 mCi   Stress Radionuclide:  Technetium 58m Sestamibi  Stress Radionuclide Dose: 33.0 mCi           Stress Protocol Rest HR: 62 Stress HR: 96  Rest BP: 157/104 Stress BP: 193/94  Exercise Time (min): n/a METS: n/a   Predicted Max HR: 152 bpm % Max HR: 63.16 bpm Rate Pressure Product: 18528   Dose of Adenosine (mg):  n/a Dose of Lexiscan: 0.4 mg  Dose of Atropine (mg): n/a Dose of Dobutamine: n/a mcg/kg/min (at max HR)  Stress Test Technologist: Perrin Maltese, EMT-P  Nuclear Technologist:  Vedia Pereyra, CNMT     Rest Procedure:  Myocardial perfusion imaging was performed at rest 45 minutes following the intravenous administration of Technetium 1m Sestamibi. Rest ECG: Sinus rhythm, rate 62, poor R wave progression, nonspecific ST-T wave changes anterior leads  Stress Procedure:  The patient received IV Lexiscan 0.4 mg over 15-seconds.  Technetium 21m  Sestamibi injected at 30-seconds. This patient had dizziness, abdominal cramps, and pressure in his head with the Lexiscan injection. Quantitative spect images were obtained after a 45 minute delay. Stress ECG: No significant change from baseline ECG  QPS Raw Data Images:  Normal; no motion artifact; normal heart/lung ratio. Stress Images:  There is decreased uptake of severe intensity along the inferoapical/anterior apical distribution as well as distal inferoseptal distribution consistent with scar. There is no significant peri-infarct ischemia identified. Rest Images:  As noted above. Otherwise homogeneous radiotracer uptake. Subtraction (SDS):  No evidence of ischemia. Transient Ischemic Dilatation (Normal <1.22):  1.17 Lung/Heart Ratio (Normal <0.45):  0.26  Quantitative Gated Spect Images QGS EDV:  178 ml QGS ESV:  124 ml  Impression Exercise Capacity:  Lexiscan with no exercise. BP Response:  Normal blood pressure response. Clinical Symptoms:  Typical symptoms with Lexiscan ECG Impression:  No significant ST segment change suggestive of ischemia. 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 No significant peri-infarct ischemia identified..  LV Ejection Fraction: 31%, severely reduced.  LV Wall Motion:  There is akinesis/dyskinesis of the apical distribution.  Candee Furbish, MD

## 2014-01-17 ENCOUNTER — Telehealth: Payer: Self-pay

## 2014-01-17 DIAGNOSIS — R9439 Abnormal result of other cardiovascular function study: Secondary | ICD-10-CM

## 2014-01-17 NOTE — Telephone Encounter (Signed)
Message copied by Lamar Laundry on Fri Jan 17, 2014  1:41 PM ------      Message from: Daneen Schick      Created: Thu Jan 16, 2014  8:19 PM       Moderate risk study with evidence of severe two vessel disease and decreased heart pumping ability. Needs coronary angio ASAP by me. Please set up. Needs to be left and right heart with cor angio and possible PCI ------

## 2014-01-17 NOTE — Telephone Encounter (Signed)
Message copied by Lamar Laundry on Fri Jan 17, 2014  3:22 PM ------      Message from: Daneen Schick      Created: Thu Jan 16, 2014  8:19 PM       Moderate risk study with evidence of severe two vessel disease and decreased heart pumping ability. Needs coronary angio ASAP by me. Please set up. Needs to be left and right heart with cor angio and possible PCI ------

## 2014-01-17 NOTE — Telephone Encounter (Signed)
Pt aware of myoview results and Dr.Smith recommendations.Moderate risk study with evidence of severe two vessel disease and decreased heart pumping ability. Needs coronary angio ASAP by me. Please set up. Needs to be left and right heart with cor angio and possible PCI  Pt adv to seek medical care for prolonged chest pain  Pt adv if first available date with Dr.Smith would be 9/16. pts that he has other appts that day. Pt agreeable with 9/17. Adv pt I will call [facility] to sch and call back with a time and preprocedure instructions.pt verbalized understanding.  Pt aware Pt cath sch for 9/17 @ 10:30 with Dr.Smith, pt will have labs and cxr on 9/15, pt given verbal pre procedure  Instructions, written instructions left at front desk for pt to pick up

## 2014-01-20 ENCOUNTER — Other Ambulatory Visit: Payer: Self-pay | Admitting: Interventional Cardiology

## 2014-01-20 ENCOUNTER — Encounter (HOSPITAL_COMMUNITY): Payer: Self-pay | Admitting: Pharmacy Technician

## 2014-01-20 DIAGNOSIS — I209 Angina pectoris, unspecified: Secondary | ICD-10-CM

## 2014-01-21 ENCOUNTER — Other Ambulatory Visit: Payer: Self-pay | Admitting: Interventional Cardiology

## 2014-01-21 ENCOUNTER — Ambulatory Visit
Admission: RE | Admit: 2014-01-21 | Discharge: 2014-01-21 | Disposition: A | Discharge status: Home / Self Care | Payer: Medicare HMO | Source: Ambulatory Visit | Attending: Interventional Cardiology | Admitting: Interventional Cardiology

## 2014-01-21 ENCOUNTER — Other Ambulatory Visit (INDEPENDENT_AMBULATORY_CARE_PROVIDER_SITE_OTHER): Payer: Medicare HMO

## 2014-01-21 DIAGNOSIS — I209 Angina pectoris, unspecified: Secondary | ICD-10-CM

## 2014-01-21 DIAGNOSIS — R9439 Abnormal result of other cardiovascular function study: Secondary | ICD-10-CM

## 2014-01-21 LAB — PROTIME-INR
INR: 0.9 ratio (ref 0.8–1.0)
Prothrombin Time: 10.2 s (ref 9.6–13.1)

## 2014-01-21 LAB — BASIC METABOLIC PANEL
BUN: 17 mg/dL (ref 6–23)
CALCIUM: 9.2 mg/dL (ref 8.4–10.5)
CHLORIDE: 99 meq/L (ref 96–112)
CO2: 27 meq/L (ref 19–32)
Creatinine, Ser: 1 mg/dL (ref 0.4–1.5)
GFR: 76.13 mL/min (ref >60.00)
GLUCOSE: 309 mg/dL — AB (ref 70–99)
Potassium: 3.9 mEq/L (ref 3.5–5.1)
SODIUM: 137 meq/L (ref 135–145)

## 2014-01-22 LAB — CBC WITH DIFFERENTIAL/PLATELET
BASOS PCT: 0.4 % (ref 0.0–3.0)
Basophils Absolute: 0 10*3/uL (ref 0.0–0.1)
EOS ABS: 0.2 10*3/uL (ref 0.0–0.7)
Eosinophils Relative: 2.5 % (ref 0.0–5.0)
HCT: 45.5 % (ref 39.0–52.0)
HEMOGLOBIN: 15.5 g/dL (ref 13.0–17.0)
LYMPHS PCT: 22.1 % (ref 12.0–46.0)
Lymphs Abs: 1.8 10*3/uL (ref 0.7–4.0)
MCHC: 34 g/dL (ref 30.0–36.0)
MCV: 92.3 fl (ref 78.0–100.0)
MONO ABS: 0.4 10*3/uL (ref 0.1–1.0)
Monocytes Relative: 4.9 % (ref 3.0–12.0)
NEUTROS ABS: 5.8 10*3/uL (ref 1.4–7.7)
Neutrophils Relative %: 70.1 % (ref 43.0–77.0)
Platelets: 168 10*3/uL (ref 150.0–400.0)
RBC: 4.94 Mil/uL (ref 4.22–5.81)
RDW: 13.8 % (ref 11.5–15.5)
WBC: 8.3 10*3/uL (ref 4.0–10.5)

## 2014-01-23 ENCOUNTER — Other Ambulatory Visit: Payer: Self-pay | Admitting: Physician Assistant

## 2014-01-23 ENCOUNTER — Ambulatory Visit (HOSPITAL_COMMUNITY)
Admission: RE | Admit: 2014-01-23 | Discharge: 2014-01-23 | Disposition: A | Discharge status: Home / Self Care | Payer: Medicare HMO | Source: Ambulatory Visit | Attending: Interventional Cardiology | Admitting: Interventional Cardiology

## 2014-01-23 ENCOUNTER — Encounter (HOSPITAL_COMMUNITY)
Admission: RE | Disposition: A | Discharge status: Home / Self Care | Payer: Self-pay | Source: Ambulatory Visit | Attending: Interventional Cardiology

## 2014-01-23 DIAGNOSIS — E7800 Pure hypercholesterolemia, unspecified: Secondary | ICD-10-CM | POA: Insufficient documentation

## 2014-01-23 DIAGNOSIS — I2582 Chronic total occlusion of coronary artery: Secondary | ICD-10-CM | POA: Diagnosis not present

## 2014-01-23 DIAGNOSIS — I252 Old myocardial infarction: Secondary | ICD-10-CM | POA: Diagnosis not present

## 2014-01-23 DIAGNOSIS — I2510 Atherosclerotic heart disease of native coronary artery without angina pectoris: Secondary | ICD-10-CM | POA: Insufficient documentation

## 2014-01-23 DIAGNOSIS — F17200 Nicotine dependence, unspecified, uncomplicated: Secondary | ICD-10-CM | POA: Diagnosis not present

## 2014-01-23 DIAGNOSIS — E119 Type 2 diabetes mellitus without complications: Secondary | ICD-10-CM | POA: Insufficient documentation

## 2014-01-23 DIAGNOSIS — Z860100 Personal history of colon polyps, unspecified: Secondary | ICD-10-CM | POA: Insufficient documentation

## 2014-01-23 DIAGNOSIS — N400 Enlarged prostate without lower urinary tract symptoms: Secondary | ICD-10-CM | POA: Insufficient documentation

## 2014-01-23 DIAGNOSIS — Z9861 Coronary angioplasty status: Secondary | ICD-10-CM | POA: Insufficient documentation

## 2014-01-23 DIAGNOSIS — E785 Hyperlipidemia, unspecified: Secondary | ICD-10-CM | POA: Insufficient documentation

## 2014-01-23 DIAGNOSIS — I5020 Unspecified systolic (congestive) heart failure: Secondary | ICD-10-CM | POA: Diagnosis not present

## 2014-01-23 DIAGNOSIS — I209 Angina pectoris, unspecified: Secondary | ICD-10-CM

## 2014-01-23 DIAGNOSIS — I10 Essential (primary) hypertension: Secondary | ICD-10-CM | POA: Insufficient documentation

## 2014-01-23 HISTORY — PX: LEFT AND RIGHT HEART CATHETERIZATION WITH CORONARY ANGIOGRAM: SHX5449

## 2014-01-23 LAB — POCT I-STAT 3, VENOUS BLOOD GAS (G3P V)
Bicarbonate: 26.6 mEq/L — ABNORMAL HIGH (ref 20.0–24.0)
O2 SAT: 68 %
PO2 VEN: 38 mmHg (ref 30.0–45.0)
TCO2: 28 mmol/L (ref 0–100)
pCO2, Ven: 49.1 mmHg (ref 45.0–50.0)
pH, Ven: 7.341 — ABNORMAL HIGH (ref 7.250–7.300)

## 2014-01-23 LAB — POCT I-STAT 3, ART BLOOD GAS (G3+)
Acid-Base Excess: 3 mmol/L — ABNORMAL HIGH (ref 0.0–2.0)
Bicarbonate: 28.6 mEq/L — ABNORMAL HIGH (ref 20.0–24.0)
O2 Saturation: 98 %
TCO2: 30 mmol/L (ref 0–100)
pCO2 arterial: 47.3 mmHg — ABNORMAL HIGH (ref 35.0–45.0)
pH, Arterial: 7.39 (ref 7.350–7.450)
pO2, Arterial: 100 mmHg (ref 80.0–100.0)

## 2014-01-23 LAB — GLUCOSE, CAPILLARY: Glucose-Capillary: 265 mg/dL — ABNORMAL HIGH (ref 70–99)

## 2014-01-23 SURGERY — LEFT AND RIGHT HEART CATHETERIZATION WITH CORONARY ANGIOGRAM
Anesthesia: LOCAL

## 2014-01-23 MED ORDER — LIDOCAINE HCL (PF) 1 % IJ SOLN
INTRAMUSCULAR | Status: AC
  Filled 2014-01-23: qty 30

## 2014-01-23 MED ORDER — MIDAZOLAM HCL 2 MG/2ML IJ SOLN
INTRAMUSCULAR | Status: AC
  Filled 2014-01-23: qty 2

## 2014-01-23 MED ORDER — SODIUM CHLORIDE 0.9 % IV SOLN: 250.0000 mL | INTRAVENOUS | Status: DC | PRN

## 2014-01-23 MED ORDER — OXYCODONE-ACETAMINOPHEN 5-325 MG PO TABS: 1.0000 | ORAL_TABLET | ORAL | Status: DC | PRN

## 2014-01-23 MED ORDER — SODIUM CHLORIDE 0.9 % IJ SOLN: 3.0000 mL | Freq: Two times a day (BID) | INTRAMUSCULAR | Status: DC

## 2014-01-23 MED ORDER — ASPIRIN 81 MG PO CHEW
CHEWABLE_TABLET | ORAL | Status: AC
  Administered 2014-01-23: 81 mg via ORAL
  Filled 2014-01-23: qty 1

## 2014-01-23 MED ORDER — CARVEDILOL 25 MG PO TABS
25.0000 mg | ORAL_TABLET | Freq: Once | ORAL | Status: AC
  Administered 2014-01-23: 25 mg via ORAL
  Filled 2014-01-23 (×2): qty 1

## 2014-01-23 MED ORDER — FENTANYL CITRATE 0.05 MG/ML IJ SOLN
INTRAMUSCULAR | Status: AC
  Filled 2014-01-23: qty 2

## 2014-01-23 MED ORDER — ASPIRIN 81 MG PO CHEW
81.0000 mg | CHEWABLE_TABLET | ORAL | Status: AC
  Administered 2014-01-23: 81 mg via ORAL

## 2014-01-23 MED ORDER — SODIUM CHLORIDE 0.9 % IV SOLN
INTRAVENOUS | Status: DC
  Administered 2014-01-23: 09:00:00 via INTRAVENOUS

## 2014-01-23 MED ORDER — VERAPAMIL HCL 2.5 MG/ML IV SOLN
INTRAVENOUS | Status: AC
  Filled 2014-01-23: qty 2

## 2014-01-23 MED ORDER — HYDRALAZINE HCL 20 MG/ML IJ SOLN: 10.0000 mg | Freq: Once | INTRAMUSCULAR | Status: AC

## 2014-01-23 MED ORDER — HEPARIN (PORCINE) IN NACL 2-0.9 UNIT/ML-% IJ SOLN
INTRAMUSCULAR | Status: AC
  Filled 2014-01-23: qty 1000

## 2014-01-23 MED ORDER — ONDANSETRON HCL 4 MG/2ML IJ SOLN: 4.0000 mg | Freq: Four times a day (QID) | INTRAMUSCULAR | Status: DC | PRN

## 2014-01-23 MED ORDER — HYDRALAZINE HCL 20 MG/ML IJ SOLN
INTRAMUSCULAR | Status: AC
  Filled 2014-01-23: qty 1

## 2014-01-23 MED ORDER — SODIUM CHLORIDE 0.9 % IJ SOLN: 3.0000 mL | INTRAMUSCULAR | Status: DC | PRN

## 2014-01-23 MED ORDER — ACETAMINOPHEN 325 MG PO TABS: 650.0000 mg | ORAL_TABLET | ORAL | Status: DC | PRN

## 2014-01-23 MED ORDER — HEPARIN SODIUM (PORCINE) 1000 UNIT/ML IJ SOLN
INTRAMUSCULAR | Status: AC
  Filled 2014-01-23: qty 1

## 2014-01-23 MED ORDER — NITROGLYCERIN 1 MG/10 ML FOR IR/CATH LAB
INTRA_ARTERIAL | Status: AC
  Filled 2014-01-23: qty 10

## 2014-01-23 MED ORDER — SODIUM CHLORIDE 0.9 % IV SOLN: INTRAVENOUS | Status: AC

## 2014-01-23 MED ADMIN — Hydralazine HCl Inj 20 MG/ML: INTRAVENOUS | @ 17:00:00

## 2014-01-23 NOTE — Progress Notes (Signed)
HAO,PA NOTIFIED OF B/P'S AFTER HYDRALAZINE AND OK TO D/C HOME; RIGHT GROIN STABLE; NO BLEEDING OR HEMATOMA

## 2014-01-23 NOTE — Discharge Instructions (Signed)

## 2014-01-23 NOTE — H&P (View-Only) (Signed)
Patient ID: Chad [name], male   DOB: [birth year], 68 y.o.   MRN: [id number] Past Medical History  Coronary artery disease (Dr. Tamala Julian) - s/p MI 6/09; s/p RCA bare metal stent; Cardiolite 10/09-EF 51%   Type 2 diabetes   Hypertension   Hyperlipidemia - target LDL < 70   Adenomatous colon polyps   Kidney stones      [address], [address], [place]  [address] Phone: [phone] Fax:  [phone]  Date:  01/01/2014   ID:  Chad [name], DOB May 15, 1945, MRN [id number]  PCP:  Orpah Melter, MD   ASSESSMENT:  1. Coronary atherosclerosis with prior RCA bare-metal stent 2009 2. Hyperlipidemia 3. Hypertension 4. Diabetes mellitus  PLAN:  1. Stress cigarette smoking cessation. 2. Stress Cardiolite 3. A followup in one year nuclear study is unremarkable   SUBJECTIVE: Chad [name] is a 68 y.o. male who is doing relatively well but having exertional dyspnea with some limitation in exertional tolerance. He denies orthopnea, PND, syncope, and palpitations.   Wt Readings from Last 3 Encounters:  01/01/14 182 lb 12.8 oz (82.918 kg)  10/31/11 190 lb 0.6 oz (86.2 kg)     Past Medical History  Diagnosis Date  . Coronary artery disease     s/p MI 10/2007; s/p RCA BMS; Cardiolite 02/2008=EF 51%  . Type II or unspecified type diabetes mellitus without mention of complication, uncontrolled     diagnosed age 40  . Benign hypertension   . Hyperlipidemia   . BPH (benign prostatic hyperplasia)   . Inferior MI 2009  . Coronary atherosclerosis of native coronary artery   . Hyperlipidemia   . Pure hypercholesterolemia   . Adenomatous colon polyp   . Kidney stones   . Smoker   . Syncopal episodes     Only 1 episode because of hypotension    Current Outpatient Prescriptions  Medication Sig Dispense Refill  . atorvastatin (LIPITOR) 40 MG tablet Take 1 tablet (40 mg total) by mouth daily.  90 tablet  0  . carvedilol (COREG) 25 MG tablet Take 1 tablet (25 mg total) by  mouth 2 (two) times daily with a meal.  180 tablet  0  . glipiZIDE (GLUCOTROL) 10 MG tablet Take 5 mg by mouth 2 (two) times daily before a meal.      . hydrochlorothiazide (HYDRODIURIL) 12.5 MG tablet Take 12.5 mg by mouth daily.      Marland Kitchen lisinopril (PRINIVIL,ZESTRIL) 40 MG tablet Take 1 tablet (40 mg total) by mouth daily.  90 tablet  0  . metFORMIN (GLUCOPHAGE) 1000 MG tablet Take 1,000 mg by mouth 2 (two) times daily with a meal.      . metFORMIN (GLUCOPHAGE) 1000 MG tablet Take 1,000 mg by mouth 2 (two) times daily with a meal.      . Multiple Vitamin (MULTIVITAMIN WITH MINERALS) TABS Take 1 tablet by mouth daily. [facility] Special[facility]      . nitroGLYCERIN (NITROSTAT) 0.4 MG SL tablet Place 0.4 mg under the tongue every 5 (five) minutes as needed.       No current facility-administered medications for this visit.    Allergies:    Allergies  Allergen Reactions  . Lisinopril-Hydrochlorothiazide Other (See Comments)    Syncope, side effects    Social History:  The patient  reports that he has been smoking Cigarettes.  He has a 25 pack-year smoking history. He has never used smokeless tobacco. He reports that he does not drink alcohol or use  illicit drugs.   ROS:  Please see the history of present illness.   No neurological complaints. No claudication.   All other systems reviewed and negative.   OBJECTIVE: VS:  BP 170/100  Pulse 74  Ht 6\' 1"  (1.854 m)  Wt 182 lb 12.8 oz (82.918 kg)  BMI 24.12 kg/m2 Well nourished, well developed, in no acute distress, appears older than stated age 23: normal Neck: JVD flat. Carotid bruit absent  Cardiac:  normal S1, S2; RRR; no murmur Lungs:  clear to auscultation bilaterally, no wheezing, rhonchi or rales Abd: soft, nontender, no hepatomegaly Ext: Edema absent. Pulses 2+ and symmetric Skin: warm and dry Neuro:  CNs 2-12 intact, no focal abnormalities noted  EKG:  Paul with progression V1 through V4. Otherwise unremarkable        Signed, Illene Labrador III, MD 01/01/2014 3:01 PM

## 2014-01-23 NOTE — CV Procedure (Signed)
Left and Right Heart Catheterization with Coronary Angiography  Report  Chad [name]  68 y.o.  male 1945/07/16  Procedure Date: 01/23/2014 Referring Physician: Orpah Melter, M.D. Primary Cardiologist: Amalia Hailey, M.D.  INDICATIONS: New systolic heart failure and exertional dyspnea  PROCEDURE: 1. Left heart catheterization; 2. Coronary angiography; 3. Left ventriculography; 4. Right heart catheterization  CONSENT:  The risks, benefits, and details of the procedure were explained in detail to the patient. Risks including death, stroke, heart attack, kidney injury, allergy, limb ischemia, bleeding and radiation injury were discussed.  The patient verbalized understanding and wanted to proceed.  Informed written consent was obtained.  PROCEDURE TECHNIQUE:  After Xylocaine anesthesia a 5 French sheath was placed in the right femoral artery using the modified Seldinger technique.  A 7 French sheath was placed in the right femoral vein using the modified 7 and technique.her initial goal was to perform the procedure from the right antecubital vein and right radial. As we exchanged the sheath in the antecubital we realize that the Angiocath that was placed before the patient was on the cath table was actually an intra-or arterial line and not a venous line. The brachial sheath had been placed in the brachial artery before he relies this. The sheath was pulled and manual pressure was held with good hemostasis and intact right radial pulse.   Coronary angiography was done using a 5 F JR 4 cm and JL 3.5 diagnostic catheters.  Left ventriculography was done using the 5 French angled pigtail catheter and power injection in the 30 RAO projection. Right heart catheterization was performed with a 6 French Swan-Ganz catheter without difficulty.  Images were reviewed and the case was terminated.   CONTRAST:  Total of 150 cc.  COMPLICATIONS:  None   HEMODYNAMICS:  Aortic pressure 134/65  mmHg; LV pressure 134/3 mmHg; LVEDP 9 mm mercury; RA 3 mmHg; RV 30/2 mmHg; PA 31/11 mmHg; PCWP(mean) 3 mmHg; Cardiac Output 4.35 L per minute; AV gradient none  ANGIOGRAPHIC DATA:   The left main coronary artery is widely patent.  The left anterior descending artery is 100% occluded in the midsegment after the first septal perforator and diagonal. The vessel fills by right to left collaterals. The first diagonal contains 50-70% stenosis at the the vessel is moderate in size. The LAD is transapical as demonstrated from prior angiograms and also has seen by collaterals.  The left circumflex artery is widely patent with diffuse luminal irregularities. 3 obtuse marginal branches arise with the second and dominant and trifurcating on the inferolateral wall. Distal circumflex contains 50% narrowing before a small third obtuse marginal the distal circumflex also supplies collaterals to the LAD.  The right coronary artery is dominant. Moderate calcification is noted. The mid vessel contains segmental 50% narrowing. The mid to distal stent is widely patent. Distal RCA contains eccentric 40% narrowing. The PDA and 1 left ventricular branch are patent without high-grade obstruction.   LEFT VENTRICULOGRAM:  Left ventricular angiogram was done in the 30 RAO projection and revealed minimal mid to distal anterior wall hypokinesis and inferoapical akinesis. The inferior wall is mildly to moderately hypokinetic. EF is 35-40%.   IMPRESSIONS:  1. 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 2. Moderate diffuse disease in the right coronary but widely patent mid right coronary stents. 3. Widely patent circumflex 4. Left ventricular dysfunction with inferior wall and inferoapical wall motion abnormality. EF 35-40%. Normal  left ventricular hemodynamics .   RECOMMENDATION:  Optimize medical therapy which may include adding long-acting nitrates to improve  collateral flow. If dyspnea, which I believe is an ischemic equivalent, continues to limit the patient consideration of CT of PCI will be entertained.Marland Kitchen

## 2014-01-23 NOTE — Interval H&P Note (Signed)
History and Physical Interval Note:  01/23/2014 7:42 AM Since this office note was prepared, the patient has continued to have exertional intolerance. The myocardial perfusion study was intermediate risk with significant reduction in LV function, EF 31%. There is a large inferolateral wall perfusion defect with peri-infarction ischemia. The decrease in EF is new. With these findings it was felt that coronary angiography is indicated to define anatomy and exclude progression of disease. He has continued to smoke, is diabetic, and has hyperlipidemia but with controlled LDL. Chad [name]  has presented today for surgery, with the diagnosis of abnormal nuclear stress test, decreased LVF  The various methods of treatment have been discussed with the patient and family. After consideration of risks, benefits and other options for treatment, the patient has consented to  Procedure(s): LEFT AND RIGHT HEART CATHETERIZATION WITH CORONARY ANGIOGRAM (N/A) as a surgical intervention .  The patient's history has been reviewed, patient examined, no change in status, stable for surgery.  I have reviewed the patient's chart and labs.  Questions were answered to the patient's satisfaction.     Sinclair Grooms

## 2014-01-24 ENCOUNTER — Telehealth: Payer: Self-pay

## 2014-01-24 DIAGNOSIS — I25119 Atherosclerotic heart disease of native coronary artery with unspecified angina pectoris: Secondary | ICD-10-CM

## 2014-01-24 MED ORDER — ISOSORBIDE MONONITRATE ER 60 MG PO TB24: 60.0000 mg | ORAL_TABLET | Freq: Every day | ORAL | Status: DC

## 2014-01-24 NOTE — Telephone Encounter (Signed)
Pt given Dr.Smith instructions to start Isosorbide 60mg  daily.Rx sent to pt pharmacy.4 wk f/u with Dr.Smith scheduled on 10/20 @8 :45am.pt verbalized understanding.

## 2014-02-25 ENCOUNTER — Ambulatory Visit (INDEPENDENT_AMBULATORY_CARE_PROVIDER_SITE_OTHER): Payer: Medicare HMO | Admitting: Interventional Cardiology

## 2014-02-25 ENCOUNTER — Encounter: Payer: Self-pay | Admitting: Interventional Cardiology

## 2014-02-25 VITALS — BP 128/80 | HR 64 | Ht 73.0 in | Wt 185.0 lb

## 2014-02-25 DIAGNOSIS — E1159 Type 2 diabetes mellitus with other circulatory complications: Secondary | ICD-10-CM

## 2014-02-25 DIAGNOSIS — E1151 Type 2 diabetes mellitus with diabetic peripheral angiopathy without gangrene: Secondary | ICD-10-CM

## 2014-02-25 DIAGNOSIS — I2510 Atherosclerotic heart disease of native coronary artery without angina pectoris: Secondary | ICD-10-CM

## 2014-02-25 DIAGNOSIS — E785 Hyperlipidemia, unspecified: Secondary | ICD-10-CM

## 2014-02-25 DIAGNOSIS — I10 Essential (primary) hypertension: Secondary | ICD-10-CM

## 2014-02-25 NOTE — Patient Instructions (Signed)
Your physician recommends that you continue on your current medications as directed. Please refer to the Current Medication list given to you today.  Your physician wants you to follow-up in: 6-8 months  You will receive a reminder letter in the mail two months in advance. If you don't receive a letter, please call our office to schedule the follow-up appointment.  

## 2014-02-25 NOTE — Progress Notes (Signed)
Patient ID: Chad [name], male   DOB: Mar 12, 1946, 68 y.o.   MRN: [id number]    [address] [place], [place]  [address] Phone: [phone] Fax:  [phone]  Date:  02/25/2014   ID:  Chad [name], DOB [birth year], MRN [id number]  PCP:  Orpah Melter, MD   ASSESSMENT:  1. Coronary atherosclerosis with known total occlusion of the LAD with collaterals 2. Hypertension, controlled 3. Chronic systolic heart failure secondary to CAD, asymptomatic  PLAN:  1. Continue beta blocker and ACE inhibitor therapy LV systolic dysfunction. 2. Hydrochlorothiazide for volume removal and reduction of dyspnea. 3. Long-acting nitrates are now doing well tolerated 4. Smoking cessation is ongoing 5. Clinical followup in 6 months.    SUBJECTIVE: Chad [name] is a 68 y.o. male who is doing well. He has stopped smoking. He complains of the price of isosorbide. He has not had angina area he has not had syncope. Is no peripheral edema. He denies headache.  Recent cath demonstrated:: 1. 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  2. Moderate diffuse disease in the right coronary but widely patent mid right coronary stents.  3. Widely patent circumflex  4. Left ventricular dysfunction with inferior wall and inferoapical wall motion abnormality. EF 35-40%. Normal left ventricular hemodynamics     Wt Readings from Last 3 Encounters:  02/25/14 185 lb (83.915 kg)  01/23/14 182 lb (82.555 kg)  01/23/14 182 lb (82.555 kg)     Past Medical History  Diagnosis Date  . Coronary artery disease     s/p MI 10/2007; s/p RCA BMS; Cardiolite 02/2008=EF 51%  . Type II or unspecified type diabetes mellitus without mention of complication, uncontrolled     diagnosed age 1  . Benign hypertension   . Hyperlipidemia   . BPH (benign prostatic hyperplasia)   . Inferior MI 2009  . Coronary atherosclerosis of native coronary artery     . Hyperlipidemia   . Pure hypercholesterolemia   . Adenomatous colon polyp   . Kidney stones   . Smoker   . Syncopal episodes     Only 1 episode because of hypotension    Current Outpatient Prescriptions  Medication Sig Dispense Refill  . atorvastatin (LIPITOR) 40 MG tablet Take 1 tablet (40 mg total) by mouth daily.  90 tablet  0  . carvedilol (COREG) 25 MG tablet Take 1 tablet (25 mg total) by mouth 2 (two) times daily with a meal.  180 tablet  0  . glipiZIDE (GLUCOTROL) 10 MG tablet Take 5 mg by mouth 2 (two) times daily before a meal.      . hydrochlorothiazide (HYDRODIURIL) 12.5 MG tablet Take 12.5 mg by mouth every Monday, Wednesday, and Friday.      . isosorbide mononitrate (IMDUR) 60 MG 24 hr tablet Take 1 tablet (60 mg total) by mouth daily.  30 tablet  5  . lisinopril (PRINIVIL,ZESTRIL) 40 MG tablet Take 1 tablet (40 mg total) by mouth daily.  90 tablet  0  . metFORMIN (GLUCOPHAGE) 1000 MG tablet Take 1,000 mg by mouth 2 (two) times daily with a meal.      . Multiple Vitamin (MULTIVITAMIN WITH MINERALS) TABS Take 1 tablet by mouth daily. [facility] [facility]      . nitroGLYCERIN (NITROSTAT) 0.4 MG SL tablet Place 0.4 mg under the tongue every 5 (five) minutes as needed.      . pioglitazone (ACTOS) 30 MG tablet  30 mg daily.        No current facility-administered medications for this visit.    Allergies:    Allergies  Allergen Reactions  . Lisinopril-Hydrochlorothiazide Other (See Comments)    Syncope, side effects    Social History:  The patient  reports that he has quit smoking. His smoking use included Cigarettes. He has a 25 pack-year smoking history. He has never used smokeless tobacco. He reports that he does not drink alcohol or use illicit drugs.   ROS:  Please see the history of present illness.   No claudication   All other systems reviewed and negative.   OBJECTIVE: VS:  BP 128/80  Pulse 64  Ht 6\' 1"  (1.854 m)  Wt 185 lb (83.915 kg)  BMI 24.41 kg/m2 Well  nourished, well developed, in no acute distress, appears older than stated age  46: normal Neck: JVD flat. Carotid bruit absent  Cardiac:  normal S1, S2; RRR; no murmur Lungs:  clear to auscultation bilaterally, no wheezing, rhonchi or rales Abd: soft, nontender, no hepatomegaly Ext: Edema absent. Pulses 2+  Skin: warm and dry Neuro:  CNs 2-12 intact, no focal abnormalities noted  EKG:  Normal sinus rhythm with anterior biphasic T waves       Signed, Illene Labrador III, MD 02/25/2014 9:08 AM

## 2014-03-20 ENCOUNTER — Other Ambulatory Visit: Payer: Self-pay | Admitting: Interventional Cardiology

## 2014-04-17 ENCOUNTER — Encounter (HOSPITAL_COMMUNITY): Payer: Self-pay | Admitting: Interventional Cardiology

## 2014-06-05 DIAGNOSIS — E780 Pure hypercholesterolemia: Secondary | ICD-10-CM | POA: Diagnosis not present

## 2014-06-05 DIAGNOSIS — I10 Essential (primary) hypertension: Secondary | ICD-10-CM | POA: Diagnosis not present

## 2014-06-05 DIAGNOSIS — E1165 Type 2 diabetes mellitus with hyperglycemia: Secondary | ICD-10-CM | POA: Diagnosis not present

## 2014-07-28 ENCOUNTER — Other Ambulatory Visit: Payer: Self-pay | Admitting: Interventional Cardiology

## 2014-07-31 ENCOUNTER — Other Ambulatory Visit: Payer: Self-pay

## 2014-07-31 MED ORDER — ISOSORBIDE MONONITRATE ER 60 MG PO TB24: 60.0000 mg | ORAL_TABLET | Freq: Every day | ORAL | Status: DC

## 2014-09-03 ENCOUNTER — Ambulatory Visit (INDEPENDENT_AMBULATORY_CARE_PROVIDER_SITE_OTHER): Payer: Commercial Managed Care - HMO | Admitting: Interventional Cardiology

## 2014-09-03 ENCOUNTER — Encounter: Payer: Self-pay | Admitting: Interventional Cardiology

## 2014-09-03 VITALS — BP 152/78 | HR 72 | Ht 73.0 in | Wt 185.0 lb

## 2014-09-03 DIAGNOSIS — I2510 Atherosclerotic heart disease of native coronary artery without angina pectoris: Secondary | ICD-10-CM | POA: Diagnosis not present

## 2014-09-03 DIAGNOSIS — E1159 Type 2 diabetes mellitus with other circulatory complications: Secondary | ICD-10-CM | POA: Diagnosis not present

## 2014-09-03 DIAGNOSIS — I5032 Chronic diastolic (congestive) heart failure: Secondary | ICD-10-CM | POA: Insufficient documentation

## 2014-09-03 DIAGNOSIS — E785 Hyperlipidemia, unspecified: Secondary | ICD-10-CM | POA: Diagnosis not present

## 2014-09-03 DIAGNOSIS — I5022 Chronic systolic (congestive) heart failure: Secondary | ICD-10-CM | POA: Diagnosis not present

## 2014-09-03 NOTE — Patient Instructions (Signed)
Medication Instructions:  Your physician recommends that you continue on your current medications as directed. Please refer to the Current Medication list given to you today.   Labwork: None   Testing/Procedures: Your physician has requested that you have an echocardiogram. Echocardiography is a painless test that uses sound waves to create images of your heart. It provides your doctor with information about the size and shape of your heart and how well your heart's chambers and valves are working. This procedure takes approximately one hour. There are no restrictions for this procedure. (To be scheduled in Sept 2016)  Follow-Up: Your physician wants you to follow-up in: 6 months with Dr.Smith You will receive a reminder letter in the mail two months in advance. If you don't receive a letter, please call our office to schedule the follow-up appointment.   Any Other Special Instructions Will Be Listed Below (If Applicable).

## 2014-09-03 NOTE — Progress Notes (Signed)
Cardiology Office Note   Date:  09/03/2014   ID:  Chad [name], DOB 1946-03-28, MRN [id number]  PCP:  Orpah Melter, MD  Cardiologist:   Sinclair Grooms, MD   Chief Complaint  Patient presents with  . Cardiomyopathy    Ischemic    History of Present Illness: Chad [name] is a 69 y.o. male who presents for coronary artery disease, chronic ischemic heart disease with systolic heart failure, chronic total occlusion of the LAD, right coronary stents, tobacco abuse, diabetes mellitus, and hypertension.  He is doing well. He denies angina. No nitroglycerin use since starting isosorbide. Walking on a regular basis without significant dyspnea or chest discomfort. No episodes of recurrent syncope.    Past Medical History  Diagnosis Date  . Coronary artery disease     s/p MI 10/2007; s/p RCA BMS; Cardiolite 02/2008=EF 51%  . Type II or unspecified type diabetes mellitus without mention of complication, uncontrolled     diagnosed age 61  . Benign hypertension   . Hyperlipidemia   . BPH (benign prostatic hyperplasia)   . Inferior MI 2009  . Coronary atherosclerosis of native coronary artery   . Hyperlipidemia   . Pure hypercholesterolemia   . Adenomatous colon polyp   . Kidney stones   . Smoker   . Syncopal episodes     Only 1 episode because of hypotension    Past Surgical History  Procedure Laterality Date  . Coronary stent placement  10/2007    Liberte stent to RCA (BMS)  . Transurethral resection of prostate    . Left and right heart catheterization with coronary angiogram N/A 01/23/2014    Procedure: LEFT AND RIGHT HEART CATHETERIZATION WITH CORONARY ANGIOGRAM;  Surgeon: Sinclair Grooms, MD;  Location[facility];  Service: Cardiovascular;  Laterality: N/A;     Current Outpatient Prescriptions  Medication Sig Dispense Refill  . atorvastatin (LIPITOR) 40 MG tablet Take 1 tablet (40 mg total) by mouth daily. 90 tablet 0  . carvedilol (COREG) 25 MG tablet Take  1 tablet (25 mg total) by mouth 2 (two) times daily with a meal. 180 tablet 0  . glipiZIDE (GLUCOTROL) 10 MG tablet Take 5 mg by mouth 2 (two) times daily before a meal.    . hydrochlorothiazide (HYDRODIURIL) 12.5 MG tablet Take 12.5 mg by mouth every Monday, Wednesday, and Friday.    . isosorbide mononitrate (IMDUR) 60 MG 24 hr tablet Take 1 tablet (60 mg total) by mouth daily. 30 tablet 5  . lisinopril (PRINIVIL,ZESTRIL) 40 MG tablet Take 1 tablet (40 mg total) by mouth daily. 90 tablet 0  . metFORMIN (GLUCOPHAGE) 1000 MG tablet Take 1,000 mg by mouth 2 (two) times daily with a meal.    . Multiple Vitamin (MULTIVITAMIN WITH MINERALS) TABS Take 1 tablet by mouth daily. [facility] Specialist    . nitroGLYCERIN (NITROSTAT) 0.4 MG SL tablet Place 0.4 mg under the tongue every 5 (five) minutes as needed.    . pioglitazone (ACTOS) 30 MG tablet 30 mg daily.      No current facility-administered medications for this visit.    Allergies:   Lisinopril-hydrochlorothiazide    Social History:  The patient  reports that he has quit smoking. His smoking use included Cigarettes. He has a 25 pack-year smoking history. He has never used smokeless tobacco. He reports that he does not drink alcohol or use illicit drugs.   Family History:  The patient's family history includes Diabetes Mellitus II in  his mother; Healthy in his brother and sister; Heart disease in his father.    ROS:  Please see the history of present illness.   Otherwise, review of systems are positive for cough without hemoptysis..   All other systems are reviewed and negative.    PHYSICAL EXAM: VS:  BP 152/78 mmHg  Pulse 72  Ht 6\' 1"  (1.854 m)  Wt 185 lb (83.915 kg)  BMI 24.41 kg/m2  SpO2 95% , BMI Body mass index is 24.41 kg/(m^2). GEN: Well nourished, well developed, in no acute distress HEENT: normal Neck: no JVD, carotid bruits, or masses Cardiac: RRR; no murmurs, rubs, or gallops,no edema  Respiratory:  clear to auscultation  bilaterally, normal work of breathing GI: soft, nontender, nondistended, + BS MS: no deformity or atrophy Skin: warm and dry, no rash Neuro:  Strength and sensation are intact Psych: euthymic mood, full affect   EKG:  EKG is not ordered today.    Recent Labs: 01/21/2014: BUN 17; Creatinine 1.0; Hemoglobin 15.5; Platelets 168.0; Potassium 3.9; Sodium 137    Lipid Panel    Component Value Date/Time   CHOL 105 10/31/2011 0639   TRIG 126 10/31/2011 0639   HDL 26* 10/31/2011 0639   CHOLHDL 4.0 10/31/2011 0639   VLDL 25 10/31/2011 0639   LDLCALC 54 10/31/2011 0639      Wt Readings from Last 3 Encounters:  09/03/14 185 lb (83.915 kg)  02/25/14 185 lb (83.915 kg)  01/23/14 182 lb (82.555 kg)      Other studies Reviewed: Additional studies/ records that were reviewed today include: .    ASSESSMENT AND PLAN:  Coronary artery disease involving native coronary artery of native heart without angina pectoris: No angina  Hyperlipidemia: On therapy  Essential hypertension: Controlled  Chronic systolic HF (heart failure): No evidence of volume overload     Current medicines are reviewed at length with the patient today.  The patient does not have concerns regarding medicines.  The following changes have been made:  no change  Labs/ tests ordered today include:   Orders Placed This Encounter  Procedures  . 2D Echocardiogram without contrast     Disposition:   FU with HS in 6 months  Signed, Sinclair Grooms, MD  09/03/2014 11:54 AM    [facility] [place], [place], [place]  [address] Phone: [phone]; Fax: [phone]

## 2014-09-17 DIAGNOSIS — I2510 Atherosclerotic heart disease of native coronary artery without angina pectoris: Secondary | ICD-10-CM | POA: Diagnosis not present

## 2014-09-17 DIAGNOSIS — I10 Essential (primary) hypertension: Secondary | ICD-10-CM | POA: Diagnosis not present

## 2014-09-17 DIAGNOSIS — E780 Pure hypercholesterolemia: Secondary | ICD-10-CM | POA: Diagnosis not present

## 2014-09-17 DIAGNOSIS — E1165 Type 2 diabetes mellitus with hyperglycemia: Secondary | ICD-10-CM | POA: Diagnosis not present

## 2014-11-03 ENCOUNTER — Other Ambulatory Visit: Payer: Self-pay

## 2014-11-09 IMAGING — CR DG CHEST 2V
2 series · 2 of 2 positions shown · non-contrast
Comparison: 10/30/2011

CLINICAL DATA: Preoperative testing for cardiac catheterization
with recent abnormal stress test, history of cardiac stent in
smoking

EXAM:
CHEST  2 VIEW

[w chest pa]
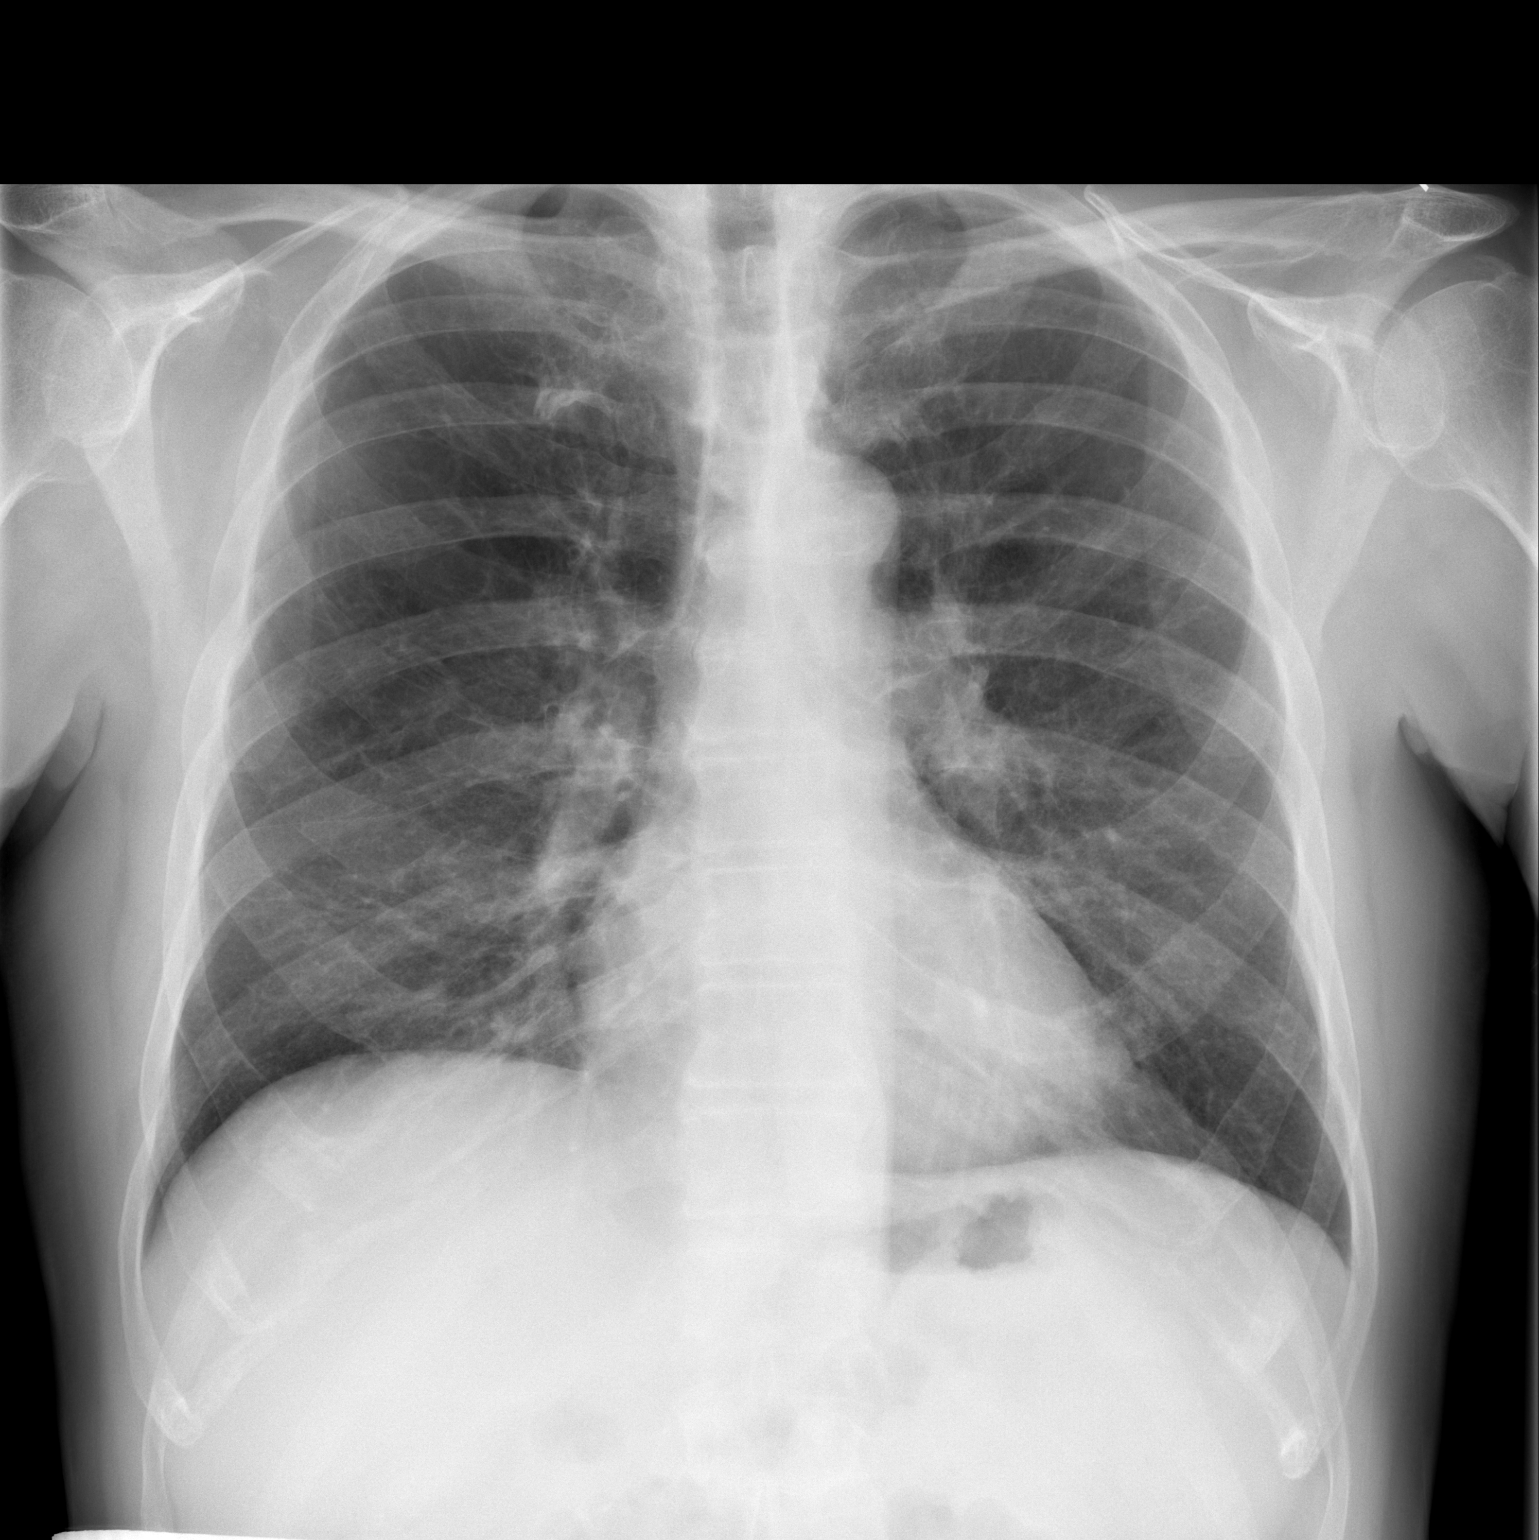

[w chest lat]
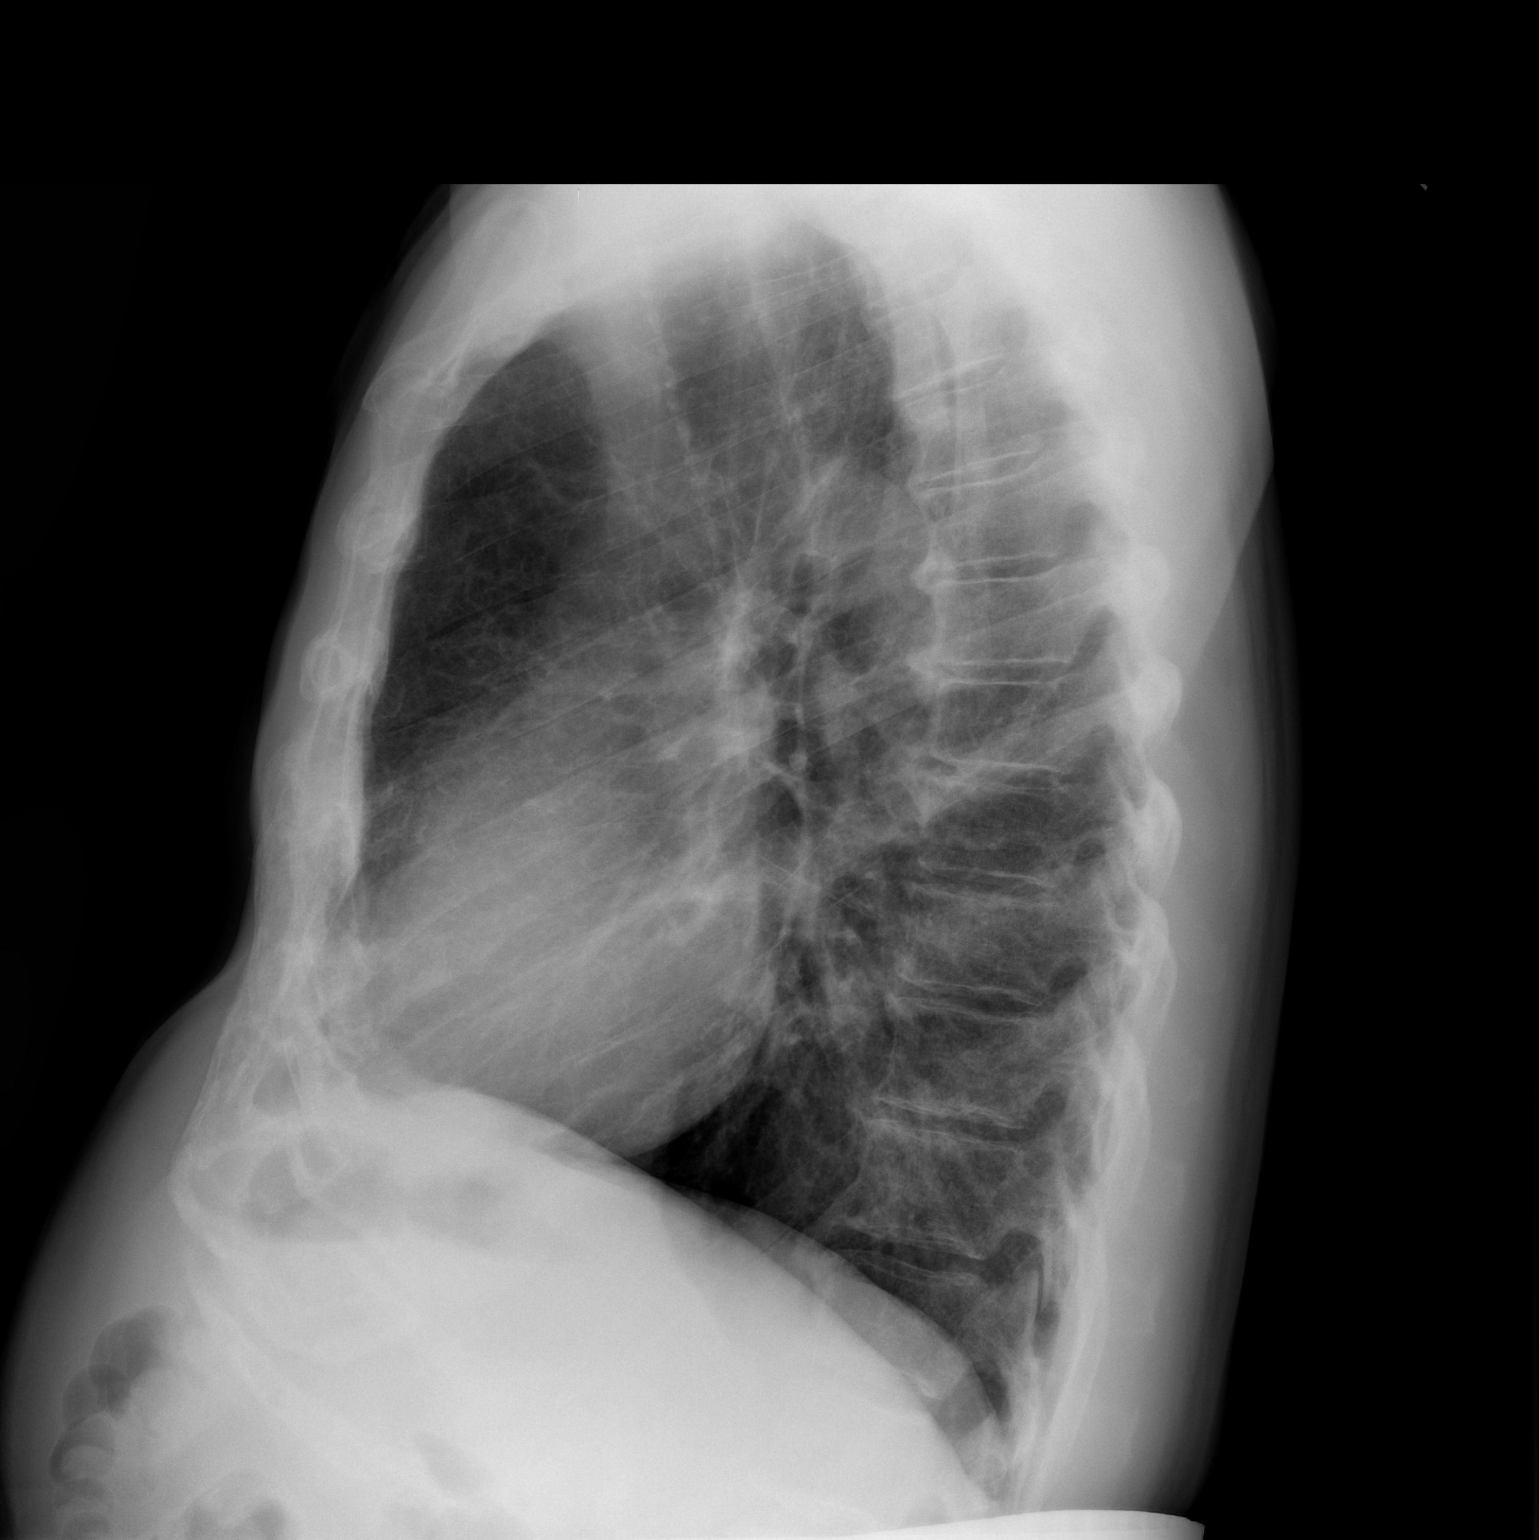

[2 of 2 positions shown; findings below may reference images not displayed]

FINDINGS: The heart size and vascular pattern are normal. No consolidation or
effusion. There is hyperinflation suggesting COPD. Stable mild
scattered fibrosis throughout both lungs, unchanged.
IMPRESSION: COPD with mild fibrosis, and no acute finding

## 2014-11-25 DIAGNOSIS — H5210 Myopia, unspecified eye: Secondary | ICD-10-CM | POA: Diagnosis not present

## 2015-01-15 DIAGNOSIS — I10 Essential (primary) hypertension: Secondary | ICD-10-CM | POA: Diagnosis not present

## 2015-01-15 DIAGNOSIS — I2510 Atherosclerotic heart disease of native coronary artery without angina pectoris: Secondary | ICD-10-CM | POA: Diagnosis not present

## 2015-01-15 DIAGNOSIS — E780 Pure hypercholesterolemia: Secondary | ICD-10-CM | POA: Diagnosis not present

## 2015-01-15 DIAGNOSIS — E119 Type 2 diabetes mellitus without complications: Secondary | ICD-10-CM | POA: Diagnosis not present

## 2015-01-15 DIAGNOSIS — F17210 Nicotine dependence, cigarettes, uncomplicated: Secondary | ICD-10-CM | POA: Diagnosis not present

## 2015-01-15 DIAGNOSIS — Z23 Encounter for immunization: Secondary | ICD-10-CM | POA: Diagnosis not present

## 2015-02-02 ENCOUNTER — Other Ambulatory Visit: Payer: Self-pay

## 2015-02-02 ENCOUNTER — Ambulatory Visit (HOSPITAL_COMMUNITY): Payer: Commercial Managed Care - HMO | Attending: Cardiology

## 2015-02-02 DIAGNOSIS — E119 Type 2 diabetes mellitus without complications: Secondary | ICD-10-CM | POA: Diagnosis not present

## 2015-02-02 DIAGNOSIS — E785 Hyperlipidemia, unspecified: Secondary | ICD-10-CM | POA: Insufficient documentation

## 2015-02-02 DIAGNOSIS — I517 Cardiomegaly: Secondary | ICD-10-CM | POA: Diagnosis not present

## 2015-02-02 DIAGNOSIS — I5022 Chronic systolic (congestive) heart failure: Secondary | ICD-10-CM

## 2015-02-02 DIAGNOSIS — I509 Heart failure, unspecified: Secondary | ICD-10-CM | POA: Diagnosis not present

## 2015-02-03 ENCOUNTER — Telehealth: Payer: Self-pay

## 2015-02-03 NOTE — Telephone Encounter (Signed)
-----   Message from Belva Crome, MD sent at 02/02/2015  3:52 PM EDT ----- Compare with the nuclear study done in 2015, LV function is back into the lower limit of normal with an EF of 50-55%. The nuclear EFwas 31% in 2015. Prior echo 60% in 2013.  Overall no significant change since 2013 echocardiogram.

## 2015-02-03 NOTE — Telephone Encounter (Signed)
Pt aware of echo results. Compare with the nuclear study done in 2015, LV function is back into the lower limit of normal with an EF of 50-55%. The nuclear EFwas 31% in 2015. Prior echo 60% in 2013. Overall no significant change since 2013 echocardiogram.      pt verbalized understanding.

## 2015-03-23 ENCOUNTER — Encounter: Payer: Self-pay | Admitting: Interventional Cardiology

## 2015-03-23 ENCOUNTER — Ambulatory Visit (INDEPENDENT_AMBULATORY_CARE_PROVIDER_SITE_OTHER): Payer: Commercial Managed Care - HMO | Admitting: Interventional Cardiology

## 2015-03-23 VITALS — BP 132/80 | HR 65 | Ht 73.0 in | Wt 191.0 lb

## 2015-03-23 DIAGNOSIS — I10 Essential (primary) hypertension: Secondary | ICD-10-CM | POA: Diagnosis not present

## 2015-03-23 DIAGNOSIS — I5032 Chronic diastolic (congestive) heart failure: Secondary | ICD-10-CM

## 2015-03-23 DIAGNOSIS — E1159 Type 2 diabetes mellitus with other circulatory complications: Secondary | ICD-10-CM

## 2015-03-23 DIAGNOSIS — E785 Hyperlipidemia, unspecified: Secondary | ICD-10-CM

## 2015-03-23 DIAGNOSIS — I5022 Chronic systolic (congestive) heart failure: Secondary | ICD-10-CM

## 2015-03-23 DIAGNOSIS — I2510 Atherosclerotic heart disease of native coronary artery without angina pectoris: Secondary | ICD-10-CM

## 2015-03-23 NOTE — Progress Notes (Signed)
Cardiology Office Note   Date:  03/23/2015   ID:  Chad [name], DOB [birth year], MRN [id number]  PCP:  Orpah Melter, MD  Cardiologist:  Sinclair Grooms, MD   Chief Complaint  Patient presents with  . Coronary Artery Disease      History of Present Illness: Chad [name] is a 69 y.o. male who presents for  CAD , prior inferior MI 2009 treated with bare-metal stent , subsequent total occlusion of the right coronary now with left right collaterals, chronic total occlusion of the mid LAD , left ventricular ejection fraction 35-40%. Other problems include diabetes mellitus, hypertension, an systolic heart failure improved and normal systolic function on medical therapy. Last EF was 55% by echo within the last several months.   I was doing well. He has no exertional intolerance. He denies chest discomfort. He has not needed nitroglycerin. No complications on his current medical regimen.    Past Medical History  Diagnosis Date  . Coronary artery disease     s/p MI 10/2007; s/p RCA BMS; Cardiolite 02/2008=EF 51%  . Type II or unspecified type diabetes mellitus without mention of complication, uncontrolled     diagnosed age 33  . Benign hypertension   . Hyperlipidemia   . BPH (benign prostatic hyperplasia)   . Inferior MI ([place]) 2009  . Coronary atherosclerosis of native coronary artery   . Hyperlipidemia   . Pure hypercholesterolemia   . Adenomatous colon polyp   . Kidney stones   . Smoker   . Syncopal episodes     Only 1 episode because of hypotension    Past Surgical History  Procedure Laterality Date  . Coronary stent placement  10/2007    Liberte stent to RCA (BMS)  . Transurethral resection of prostate    . Left and right heart catheterization with coronary angiogram N/A 01/23/2014    Procedure: LEFT AND RIGHT HEART CATHETERIZATION WITH CORONARY ANGIOGRAM;  Surgeon: Sinclair Grooms, MD;  Location: [facility] CATH LAB;  Service: Cardiovascular;  Laterality: N/A;      Current Outpatient Prescriptions  Medication Sig Dispense Refill  . atorvastatin (LIPITOR) 40 MG tablet Take 1 tablet (40 mg total) by mouth daily. 90 tablet 0  . carvedilol (COREG) 25 MG tablet Take 1 tablet (25 mg total) by mouth 2 (two) times daily with a meal. 180 tablet 0  . glipiZIDE (GLUCOTROL) 10 MG tablet Take 5 mg by mouth 2 (two) times daily before a meal.    . hydrochlorothiazide (HYDRODIURIL) 12.5 MG tablet Take 12.5 mg by mouth every Monday, Wednesday, and Friday.    . isosorbide mononitrate (IMDUR) 60 MG 24 hr tablet Take 1 tablet (60 mg total) by mouth daily. 30 tablet 5  . lisinopril (PRINIVIL,ZESTRIL) 40 MG tablet Take 1 tablet (40 mg total) by mouth daily. 90 tablet 0  . metFORMIN (GLUCOPHAGE) 1000 MG tablet Take 1,000 mg by mouth 2 (two) times daily with a meal.    . Multiple Vitamin (MULTIVITAMIN WITH MINERALS) TABS Take 1 tablet by mouth daily. [facility]    . nitroGLYCERIN (NITROSTAT) 0.4 MG SL tablet Place 0.4 mg under the tongue every 5 (five) minutes as needed.    . pioglitazone (ACTOS) 45 MG tablet Take 45 mg by mouth daily.     No current facility-administered medications for this visit.    Allergies:   Lisinopril-hydrochlorothiazide    Social History:  The patient  reports that he has quit smoking. His smoking use included Cigarettes.  He has a 25 pack-year smoking history. He has never used smokeless tobacco. He reports that he does not drink alcohol or use illicit drugs.   Family History:  The patient's family history includes Diabetes Mellitus II in his mother; Healthy in his brother and sister; Heart disease in his father.    ROS:  Please see the history of present illness.   Otherwise, review of systems are positive for  none.   All other systems are reviewed and negative.    PHYSICAL EXAM: VS:  BP 132/80 mmHg  Pulse 65  Ht 6\' 1"  (1.854 m)  Wt 86.637 kg (191 lb)  BMI 25.20 kg/m2 , BMI Body mass index is 25.2 kg/(m^2). GEN: Well  nourished, well developed, in no acute distress HEENT: normal Neck: no JVD, carotid bruits, or masses Cardiac: RRR.  There is no murmur, rub, or gallop. There is no edema. Respiratory:  clear to auscultation bilaterally, normal work of breathing. GI: soft, nontender, nondistended, + BS MS: no deformity or atrophy Skin: warm and dry, no rash Neuro:  Strength and sensation are intact Psych: euthymic mood, full affect   EKG:  EKG is ordered today. The ekg reveals  Normal sinus rhythm , old inferior infarct , nonspecific mid precordial T-wave inversion.   Recent Labs: No results found for requested labs within last 365 days.    Lipid Panel    Component Value Date/Time   CHOL 105 10/31/2011 0639   TRIG 126 10/31/2011 0639   HDL 26* 10/31/2011 0639   CHOLHDL 4.0 10/31/2011 0639   VLDL 25 10/31/2011 0639   LDLCALC 54 10/31/2011 0639      Wt Readings from Last 3 Encounters:  03/23/15 86.637 kg (191 lb)  09/03/14 83.915 kg (185 lb)  02/25/14 83.915 kg (185 lb)      Other studies Reviewed: Additional studies/ records that were reviewed today include: none other than recent echocardiogram.. The findings include  OPTIMIZE MEDICAL THERAPY LVEF IS NOW BACK TO NORMAL.Marland Kitchen    ASSESSMENT AND PLAN:  1. Coronary artery disease involving native coronary artery of native heart without angina pectoris  Asymptomatic  2. Hyperlipidemia  Followed by primary care, Dr. Felipa Eth  3. Type 2 diabetes mellitus with other circulatory complications (HCC)  Followed by primary care  4. Chronic systolic HF (heart failure) (HCC)  On medical therapy, LV systolic function is documented to be back in the normal range. We'll convert the problem 2 chronic diastolic heart failure.  5. HTN (hypertension), malignant  well controlled.    Current medicines are reviewed at length with the patient today.  The patient has the following concerns regarding medicines: none.  The following changes/actions  have been instituted:    Continue current therapy  Call if angina  Labs/ tests ordered today include:   Orders Placed This Encounter  Procedures  . EKG 12-Lead     Disposition:   FU with HS in 1 year  Signed, Sinclair Grooms, MD  03/23/2015 1:56 PM    [facility] [address], [place], [place]  [address] Phone: [phone]; Fax: [phone]

## 2015-03-23 NOTE — Patient Instructions (Signed)

## 2015-03-31 ENCOUNTER — Telehealth: Payer: Self-pay | Admitting: Interventional Cardiology

## 2015-03-31 MED ORDER — CARVEDILOL 25 MG PO TABS: 25.0000 mg | ORAL_TABLET | Freq: Two times a day (BID) | ORAL | Status: DC

## 2015-03-31 NOTE — Telephone Encounter (Signed)
Pt's Rx was sent to pt's pharmacy as requested. Confirmation received.  °

## 2015-03-31 NOTE — Telephone Encounter (Signed)
New message       *STAT* If patient is at the pharmacy, call can be transferred to refill team.   1. Which medications need to be refilled? (please list name of each medication and dose if known) carvedilol 25mg   2. Which pharmacy/location (including street and city if local pharmacy) is medication to be sent to? [facility]  3. Do they need a 30 day or 90 day supply? 90 day

## 2015-05-28 DIAGNOSIS — E7800 Pure hypercholesterolemia, unspecified: Secondary | ICD-10-CM | POA: Diagnosis not present

## 2015-05-28 DIAGNOSIS — I10 Essential (primary) hypertension: Secondary | ICD-10-CM | POA: Diagnosis not present

## 2015-05-28 DIAGNOSIS — Z7984 Long term (current) use of oral hypoglycemic drugs: Secondary | ICD-10-CM | POA: Diagnosis not present

## 2015-05-28 DIAGNOSIS — I509 Heart failure, unspecified: Secondary | ICD-10-CM | POA: Diagnosis not present

## 2015-05-28 DIAGNOSIS — Z23 Encounter for immunization: Secondary | ICD-10-CM | POA: Diagnosis not present

## 2015-05-28 DIAGNOSIS — I2510 Atherosclerotic heart disease of native coronary artery without angina pectoris: Secondary | ICD-10-CM | POA: Diagnosis not present

## 2015-05-28 DIAGNOSIS — E119 Type 2 diabetes mellitus without complications: Secondary | ICD-10-CM | POA: Diagnosis not present

## 2015-09-16 ENCOUNTER — Other Ambulatory Visit: Payer: Self-pay | Admitting: *Deleted

## 2015-09-16 MED ORDER — ISOSORBIDE MONONITRATE ER 60 MG PO TB24: 60.0000 mg | ORAL_TABLET | Freq: Every day | ORAL | Status: AC

## 2015-11-24 DIAGNOSIS — H5210 Myopia, unspecified eye: Secondary | ICD-10-CM | POA: Diagnosis not present

## 2015-12-02 DIAGNOSIS — R413 Other amnesia: Secondary | ICD-10-CM | POA: Diagnosis not present

## 2015-12-02 DIAGNOSIS — I2510 Atherosclerotic heart disease of native coronary artery without angina pectoris: Secondary | ICD-10-CM | POA: Diagnosis not present

## 2015-12-02 DIAGNOSIS — E119 Type 2 diabetes mellitus without complications: Secondary | ICD-10-CM | POA: Diagnosis not present

## 2015-12-02 DIAGNOSIS — I509 Heart failure, unspecified: Secondary | ICD-10-CM | POA: Diagnosis not present

## 2015-12-02 DIAGNOSIS — I10 Essential (primary) hypertension: Secondary | ICD-10-CM | POA: Diagnosis not present

## 2015-12-02 DIAGNOSIS — B351 Tinea unguium: Secondary | ICD-10-CM | POA: Diagnosis not present

## 2015-12-02 DIAGNOSIS — E1165 Type 2 diabetes mellitus with hyperglycemia: Secondary | ICD-10-CM | POA: Diagnosis not present

## 2015-12-02 DIAGNOSIS — E7800 Pure hypercholesterolemia, unspecified: Secondary | ICD-10-CM | POA: Diagnosis not present

## 2015-12-02 DIAGNOSIS — Z7984 Long term (current) use of oral hypoglycemic drugs: Secondary | ICD-10-CM | POA: Diagnosis not present

## 2015-12-09 DIAGNOSIS — M25774 Osteophyte, right foot: Secondary | ICD-10-CM | POA: Diagnosis not present

## 2015-12-09 DIAGNOSIS — B351 Tinea unguium: Secondary | ICD-10-CM | POA: Diagnosis not present

## 2015-12-09 DIAGNOSIS — M25775 Osteophyte, left foot: Secondary | ICD-10-CM | POA: Diagnosis not present

## 2015-12-31 ENCOUNTER — Encounter: Payer: Self-pay | Admitting: Neurology

## 2015-12-31 ENCOUNTER — Ambulatory Visit (INDEPENDENT_AMBULATORY_CARE_PROVIDER_SITE_OTHER): Payer: Commercial Managed Care - HMO | Admitting: Neurology

## 2015-12-31 DIAGNOSIS — R413 Other amnesia: Secondary | ICD-10-CM

## 2015-12-31 HISTORY — DX: Other amnesia: R41.3

## 2015-12-31 MED ORDER — ALPRAZOLAM 0.5 MG PO TABS: ORAL_TABLET | ORAL | 0 refills | Status: DC

## 2015-12-31 NOTE — Progress Notes (Signed)
Reason for visit: Memory disorder  Referring physician: Dr. Harrel Lemon [name] is a 70 y.o. male  History of present illness:  Chad [name] is a 70 year old right-handed white male with a history of diabetes and hypertension. The patient has in the past had a syncopal event associated with one of his medications associated with a fall and head trauma with concussion with loss of consciousness lasting several minutes. The patient had MRI of the brain around that time that was relatively unremarkable. The patient has begun having some mild memory issues over the last 2 years, most notable over the last one year. The patient has had difficulty with short-term memory, difficulty remembering names for people. He will repeat himself at times, he has difficulty following directions. He does operate a motor vehicle, he occasionally will have some difficulty with directions while driving. He denies any issues keeping up with medications and appointments. His wife does the finances. The patient denies any systemic issues such as fatigue, difficulty sleeping, he denies any problems with balance or difficulty controlling the bowels or the bladder. He does have some constipation issues at times. The patient indicates that both of his parents had some memory problems as they got older. He is sent to this office for an evaluation. His primary care physician has checked a thyroid profile that was unremarkable, a vitamin B12 level and RPR was obtained, I do not have the results of these studies.  Past Medical History:  Diagnosis Date  . Adenomatous colon polyp   . Benign hypertension   . BPH (benign prostatic hyperplasia)   . Coronary artery disease    s/p MI 10/2007; s/p RCA BMS; Cardiolite 02/2008=EF 51%  . Coronary atherosclerosis of native coronary artery   . Hyperlipidemia   . Inferior MI ([place]) 2009  . Kidney stones   . Pure hypercholesterolemia   . Smoker   . Syncopal episodes    Only 1 episode  because of hypotension  . Type II or unspecified type diabetes mellitus without mention of complication, uncontrolled    diagnosed age 63    Past Surgical History:  Procedure Laterality Date  . CORONARY STENT PLACEMENT  10/2007   Liberte stent to RCA (BMS)  . LEFT AND RIGHT HEART CATHETERIZATION WITH CORONARY ANGIOGRAM N/A 01/23/2014   Procedure: LEFT AND RIGHT HEART CATHETERIZATION WITH CORONARY ANGIOGRAM;  Surgeon: Sinclair Grooms, MD;  Location: [facility] CATH LAB;  Service: Cardiovascular;  Laterality: N/A;  . TRANSURETHRAL RESECTION OF PROSTATE      Family History  Problem Relation Age of Onset  . Heart disease Father   . Diabetes Mellitus II Mother   . Healthy Brother   . Healthy Sister   . Cancer Maternal Uncle     Social history:  reports that he has been smoking Cigarettes.  He has a 25.00 pack-year smoking history. He has never used smokeless tobacco. He reports that he does not drink alcohol or use drugs.  Medications:  Prior to Admission medications   Medication Sig Start Date End Date Taking? Authorizing Provider  atorvastatin (LIPITOR) 40 MG tablet Take 1 tablet (40 mg total) by mouth daily. 10/17/13  Yes Belva Crome, MD  carvedilol (COREG) 25 MG tablet Take 1 tablet (25 mg total) by mouth 2 (two) times daily with a meal. 03/31/15  Yes Belva Crome, MD  glipiZIDE (GLUCOTROL) 10 MG tablet Take 5 mg by mouth 2 (two) times daily before a meal.   Yes Historical  Provider, MD  isosorbide mononitrate (IMDUR) 60 MG 24 hr tablet Take 1 tablet (60 mg total) by mouth daily. 09/16/15  Yes Belva Crome, MD  lisinopril (PRINIVIL,ZESTRIL) 40 MG tablet Take 1 tablet (40 mg total) by mouth daily. 10/17/13  Yes Belva Crome, MD  metFORMIN (GLUCOPHAGE) 1000 MG tablet Take 1,000 mg by mouth 2 (two) times daily with a meal.   Yes Historical Provider, MD  Multiple Vitamin (MULTIVITAMIN WITH MINERALS) TABS Take 1 tablet by mouth daily. [facility]   Yes Historical Provider, MD    nitroGLYCERIN (NITROSTAT) 0.4 MG SL tablet Place 0.4 mg under the tongue every 5 (five) minutes as needed.   Yes Historical Provider, MD  pioglitazone (ACTOS) 45 MG tablet Take 45 mg by mouth daily. 12/19/14  Yes Historical Provider, MD  terbinafine (LAMISIL) 250 MG tablet  12/09/15  Yes Historical Provider, MD      Allergies  Allergen Reactions  . Hydrochlorothiazide Other (See Comments)    Syncope    ROS:  Out of a complete 14 system review of symptoms, the patient complains only of the following symptoms, and all other reviewed systems are negative.  Memory loss  Blood pressure (!) 152/86, pulse 74, height 6\' 1"  (1.854 m), weight 190 lb (86.2 kg).  Physical Exam  General: The patient is alert and cooperative at the time of the examination.  Eyes: Pupils are equal, round, and reactive to light. Discs are flat bilaterally.  Neck: The neck is supple, no carotid bruits are noted.  Respiratory: The respiratory examination is clear.  Cardiovascular: The cardiovascular examination reveals a regular rate and rhythm, no obvious murmurs or rubs are noted.  Skin: Extremities are without significant edema.  Neurologic Exam  Mental status: The patient is alert and oriented x 3 at the time of the examination. The patient has apparent normal recent and remote memory, with an apparently normal attention span and concentration ability. Mini-Mental Status Examination done today shows a total score of 25/30.  Cranial nerves: Facial symmetry is present. There is good sensation of the face to pinprick and soft touch bilaterally. The strength of the facial muscles and the muscles to head turning and shoulder shrug are normal bilaterally. Speech is well enunciated, no aphasia or dysarthria is noted. Extraocular movements are full. Visual fields are full. The tongue is midline, and the patient has symmetric elevation of the soft palate. No obvious hearing deficits are noted.  Motor: The motor  testing reveals 5 over 5 strength of all 4 extremities. Good symmetric motor tone is noted throughout.  Sensory: Sensory testing is intact to pinprick, soft touch, vibration sensation, and position sense on all 4 extremities. No evidence of extinction is noted.  Coordination: Cerebellar testing reveals good finger-nose-finger and heel-to-shin bilaterally. Some apraxia with the use of the lower extremities is noted.  Gait and station: Gait is normal. Tandem gait is normal. Romberg is negative. No drift is seen.  Reflexes: Deep tendon reflexes are symmetric and normal bilaterally. Toes are downgoing bilaterally.    MRI brain 10/31/11:  IMPRESSION: Mild atrophy.  No acute intracranial findings.  No acute stroke or hemorrhage.   Assessment/Plan:  1. Memory disturbance  The patient has had some progression of memory over the last year or 2. The patient has not given up any activities of daily living because of memory. The patient has a history of what sounds like a concussion associated with loss of consciousness in 2013. The patient will be sent for  MRI brain evaluation at this time. We have discussed initiation of memory medication, the patient does not wish to pursue this at this time. If desired, the patient may engage in memory research. He will follow-up in 6 months, if he changes his mind about the medications he is to contact our office.  Jill Alexanders MD 12/31/2015 11:15 AM  [facility] [address] [place], [place] [address]  Phone [phone] Fax [phone]

## 2015-12-31 NOTE — Patient Instructions (Signed)
   We will check MRI of the brain. If you decide you wish to go on a memory medication, please call.

## 2016-01-15 DIAGNOSIS — B351 Tinea unguium: Secondary | ICD-10-CM | POA: Diagnosis not present

## 2016-02-05 DIAGNOSIS — Z23 Encounter for immunization: Secondary | ICD-10-CM | POA: Diagnosis not present

## 2016-02-08 ENCOUNTER — Ambulatory Visit
Admission: RE | Admit: 2016-02-08 | Discharge: 2016-02-08 | Disposition: A | Discharge status: Home / Self Care | Payer: Commercial Managed Care - HMO | Source: Ambulatory Visit | Attending: Neurology | Admitting: Neurology

## 2016-02-08 DIAGNOSIS — R413 Other amnesia: Secondary | ICD-10-CM

## 2016-02-12 DIAGNOSIS — B351 Tinea unguium: Secondary | ICD-10-CM | POA: Diagnosis not present

## 2016-03-14 DIAGNOSIS — B351 Tinea unguium: Secondary | ICD-10-CM | POA: Diagnosis not present

## 2016-04-05 DIAGNOSIS — Z7984 Long term (current) use of oral hypoglycemic drugs: Secondary | ICD-10-CM | POA: Diagnosis not present

## 2016-04-05 DIAGNOSIS — I2510 Atherosclerotic heart disease of native coronary artery without angina pectoris: Secondary | ICD-10-CM | POA: Diagnosis not present

## 2016-04-05 DIAGNOSIS — F17210 Nicotine dependence, cigarettes, uncomplicated: Secondary | ICD-10-CM | POA: Diagnosis not present

## 2016-04-05 DIAGNOSIS — E119 Type 2 diabetes mellitus without complications: Secondary | ICD-10-CM | POA: Diagnosis not present

## 2016-04-05 DIAGNOSIS — Z86010 Personal history of colonic polyps: Secondary | ICD-10-CM | POA: Diagnosis not present

## 2016-06-02 ENCOUNTER — Telehealth: Payer: Self-pay

## 2016-06-02 DIAGNOSIS — K635 Polyp of colon: Secondary | ICD-10-CM | POA: Diagnosis not present

## 2016-06-02 DIAGNOSIS — Z86010 Personal history of colonic polyps: Secondary | ICD-10-CM | POA: Diagnosis not present

## 2016-06-02 DIAGNOSIS — D126 Benign neoplasm of colon, unspecified: Secondary | ICD-10-CM | POA: Diagnosis not present

## 2016-06-02 NOTE — Telephone Encounter (Signed)
Please inquire why he is not taking aspirin?

## 2016-06-02 NOTE — Telephone Encounter (Signed)
Dr. Cristina Gong the patient's gastroenterologist called to leave a message for Dr. Tamala Julian. He states that the patient was seen in his office today for a colonoscopy and that everything is fine but the patient is not taking any type of antiplatelet therapy and he wanted make him aware given the patient's history of stents. Message routed to Dr. Tamala Julian and his nurse.

## 2016-06-03 MED ORDER — ASPIRIN EC 81 MG PO TBEC: 81.0000 mg | DELAYED_RELEASE_TABLET | Freq: Every day | ORAL | 3 refills | Status: DC

## 2016-06-03 NOTE — Telephone Encounter (Signed)
Spoke with Dr. Tamala Julian and he said unless otherwise contraindicated d/t an allergy or issue with ASA in the past, pt needs to be an ASA 81 QD.  Spoke with pt and he said at some time a physician had told him that he did not need to be on an ASA so he never took them.  Advised pt to start ASA 81 QD and call our office if any issues once starting this.  Pt verbalized understanding and was in agreement with this plan.

## 2016-06-03 NOTE — Addendum Note (Signed)
Addended by: Loren Racer on: 06/03/2016 01:40 PM   Modules accepted: Orders

## 2016-06-07 DIAGNOSIS — K635 Polyp of colon: Secondary | ICD-10-CM | POA: Diagnosis not present

## 2016-06-07 DIAGNOSIS — D126 Benign neoplasm of colon, unspecified: Secondary | ICD-10-CM | POA: Diagnosis not present

## 2016-06-08 ENCOUNTER — Ambulatory Visit: Payer: Commercial Managed Care - HMO | Admitting: Interventional Cardiology

## 2016-06-08 DIAGNOSIS — E7800 Pure hypercholesterolemia, unspecified: Secondary | ICD-10-CM | POA: Diagnosis not present

## 2016-06-08 DIAGNOSIS — I10 Essential (primary) hypertension: Secondary | ICD-10-CM | POA: Diagnosis not present

## 2016-06-08 DIAGNOSIS — I2510 Atherosclerotic heart disease of native coronary artery without angina pectoris: Secondary | ICD-10-CM | POA: Diagnosis not present

## 2016-06-08 DIAGNOSIS — Z7984 Long term (current) use of oral hypoglycemic drugs: Secondary | ICD-10-CM | POA: Diagnosis not present

## 2016-06-08 DIAGNOSIS — I509 Heart failure, unspecified: Secondary | ICD-10-CM | POA: Diagnosis not present

## 2016-06-08 DIAGNOSIS — E119 Type 2 diabetes mellitus without complications: Secondary | ICD-10-CM | POA: Diagnosis not present

## 2016-06-14 ENCOUNTER — Ambulatory Visit (INDEPENDENT_AMBULATORY_CARE_PROVIDER_SITE_OTHER): Payer: Medicare HMO | Admitting: Interventional Cardiology

## 2016-06-14 ENCOUNTER — Encounter: Payer: Self-pay | Admitting: Interventional Cardiology

## 2016-06-14 VITALS — BP 194/100 | HR 68 | Ht 73.0 in | Wt 186.6 lb

## 2016-06-14 DIAGNOSIS — I5032 Chronic diastolic (congestive) heart failure: Secondary | ICD-10-CM | POA: Diagnosis not present

## 2016-06-14 DIAGNOSIS — E784 Other hyperlipidemia: Secondary | ICD-10-CM

## 2016-06-14 DIAGNOSIS — I10 Essential (primary) hypertension: Secondary | ICD-10-CM | POA: Diagnosis not present

## 2016-06-14 DIAGNOSIS — I2510 Atherosclerotic heart disease of native coronary artery without angina pectoris: Secondary | ICD-10-CM

## 2016-06-14 DIAGNOSIS — E7849 Other hyperlipidemia: Secondary | ICD-10-CM

## 2016-06-14 MED ORDER — HYDROCHLOROTHIAZIDE 12.5 MG PO CAPS
12.5000 mg | ORAL_CAPSULE | Freq: Every day | ORAL | 3 refills | Status: DC
Start: 2016-06-14 — End: 2017-07-10

## 2016-06-14 NOTE — Progress Notes (Signed)
Cardiology Office Note    Date:  06/14/2016   ID:  Chad [name], DOB Oct 19, 1945, MRN [id number]  PCP:  Orpah Melter, MD  Cardiologist: Sinclair Grooms, MD   Chief Complaint  Patient presents with  . Coronary Artery Disease    History of Present Illness:  Chad [name] is a 71 y.o. male who presents for CAD , prior inferior MI 2009 treated with bare-metal stent , subsequent total occlusion of the right coronary now with left right collaterals, chronic total occlusion of the mid LAD , left ventricular ejection fraction 35-40%. Other problems include diabetes mellitus, hypertension, an systolic heart failure improved and normal systolic function on medical therapy. Last EF was 55% by echo within the last several months.   As usual he has no complaints. He is not very active. He does walk without chest discomfort or excessive dyspnea. He does not smoke. He has not needed to use nitroglycerin. He denies lower extremity edema. He takes his blood pressure medications in the evening. He states he has had no therapy yet today.   Past Medical History:  Diagnosis Date  . Adenomatous colon polyp   . Benign hypertension   . BPH (benign prostatic hyperplasia)   . Coronary artery disease    s/p MI 10/2007; s/p RCA BMS; Cardiolite 02/2008=EF 51%  . Coronary atherosclerosis of native coronary artery   . Hyperlipidemia   . Inferior MI ([place]) 2009  . Kidney stones   . Memory difficulty 12/31/2015  . Pure hypercholesterolemia   . Smoker   . Syncopal episodes    Only 1 episode because of hypotension  . Type II or unspecified type diabetes mellitus without mention of complication, uncontrolled    diagnosed age 59    Past Surgical History:  Procedure Laterality Date  . CORONARY STENT PLACEMENT  10/2007   Liberte stent to RCA (BMS)  . LEFT AND RIGHT HEART CATHETERIZATION WITH CORONARY ANGIOGRAM N/A 01/23/2014   Procedure: LEFT AND RIGHT HEART CATHETERIZATION WITH CORONARY ANGIOGRAM;  Surgeon:  Sinclair Grooms, MD;  Location: [facility] CATH LAB;  Service: Cardiovascular;  Laterality: N/A;  . TRANSURETHRAL RESECTION OF PROSTATE      Current Medications: Outpatient Medications Prior to Visit  Medication Sig Dispense Refill  . aspirin EC 81 MG tablet Take 1 tablet (81 mg total) by mouth daily. 90 tablet 3  . atorvastatin (LIPITOR) 40 MG tablet Take 1 tablet (40 mg total) by mouth daily. 90 tablet 0  . carvedilol (COREG) 25 MG tablet Take 1 tablet (25 mg total) by mouth 2 (two) times daily with a meal. 180 tablet 3  . glipiZIDE (GLUCOTROL) 10 MG tablet Take 5 mg by mouth 2 (two) times daily before a meal.    . isosorbide mononitrate (IMDUR) 60 MG 24 hr tablet Take 1 tablet (60 mg total) by mouth daily. 30 tablet 5  . lisinopril (PRINIVIL,ZESTRIL) 40 MG tablet Take 1 tablet (40 mg total) by mouth daily. 90 tablet 0  . metFORMIN (GLUCOPHAGE) 1000 MG tablet Take 1,000 mg by mouth 2 (two) times daily with a meal.    . Multiple Vitamin (MULTIVITAMIN WITH MINERALS) TABS Take 1 tablet by mouth daily. Centrum Specialist    . nitroGLYCERIN (NITROSTAT) 0.4 MG SL tablet Place 0.4 mg under the tongue every 5 (five) minutes as needed.    . pioglitazone (ACTOS) 45 MG tablet Take 45 mg by mouth daily.    Marland Kitchen ALPRAZolam (XANAX) 0.5 MG tablet Take 2 tablets approximately  45 minutes prior to the MRI study, take a third tablet if needed. 3 tablet 0  . terbinafine (LAMISIL) 250 MG tablet      No facility-administered medications prior to visit.      Allergies:   Patient has no known allergies.   Social History   Social History  . Marital status: Married    Spouse name: N/A  . Number of children: 4  . Years of education: HS   Occupational History  . Retired    Social History Main Topics  . Smoking status: Former Smoker    Packs/day: 0.50    Years: 50.00    Types: Cigarettes    Quit date: 01/08/2016  . Smokeless tobacco: Never Used  . Alcohol use No  . Drug use: No  . Sexual activity: Yes    Other Topics Concern  . None   Social History Narrative   Retired from [employer]. Married greater than 40 years . 4 children.   Right-handed   Caffeine: at least 3 cups of coffee per day     Family History:  The patient's family history includes Cancer in his maternal uncle; Dementia in his father and mother; Diabetes Mellitus II in his mother; Healthy in his brother and sister; Heart disease in his father.   ROS:   Please see the history of present illness.    Back pain.  All other systems reviewed and are negative.   PHYSICAL EXAM:   VS:  BP (!) 194/100 (BP Location: Left Arm)   Pulse 68   Ht 6\' 1"  (1.854 m)   Wt 186 lb 9.6 oz (84.6 kg)   BMI 24.62 kg/m    GEN: Well nourished, well developed, in no acute distress  HEENT: normal  Neck: no JVD, carotid bruits, or masses Cardiac: RRR; no murmurs, rubs, or gallops,no edema  Respiratory:  clear to auscultation bilaterally, normal work of breathing GI: soft, nontender, nondistended, + BS MS: no deformity or atrophy  Skin: warm and dry, no rash Neuro:  Alert and Oriented x 3, Strength and sensation are intact Psych: euthymic mood, full affect  Wt Readings from Last 3 Encounters:  06/14/16 186 lb 9.6 oz (84.6 kg)  12/31/15 190 lb (86.2 kg)  03/23/15 191 lb (86.6 kg)      Studies/Labs Reviewed:   EKG:  EKG  Normal sinus rhythm at 68 bpm with normal EKG appearance.  Recent Labs: No results found for requested labs within last 8760 hours.   Lipid Panel    Component Value Date/Time   CHOL 105 10/31/2011 0639   TRIG 126 10/31/2011 0639   HDL 26 (L) 10/31/2011 0639   CHOLHDL 4.0 10/31/2011 0639   VLDL 25 10/31/2011 0639   LDLCALC 54 10/31/2011 0639    Additional studies/ records that were reviewed today include:  none    ASSESSMENT:    1. Essential hypertension   2. Chronic diastolic heart failure ([place])   3. Coronary artery disease, angina presence unspecified, unspecified vessel or lesion type, unspecified  whether native or transplanted heart   4. Other hyperlipidemia      PLAN:  In order of problems listed above:  1. Extremely poor blood pressure control. Start HCTZ 12.5 mg per day. Blood pressure clinic and basic metabolic panel in 4 weeks.  2. No evidence of volume overload. 3. Known obstructive coronary disease but no angina or symptoms to suggest ischemic equivalent. 4. The recent lipid panel performed by primary care, Dr. Olen Pel revealed an  LDL.  Clinical follow-up in one year with me. Earlier if blood pressure is difficult to control with the addition of low-dose diuretic therapy. He will be seen in the blood pressure clinic.  Medication Adjustments/Labs and Tests Ordered: Current medicines are reviewed at length with the patient today.  Concerns regarding medicines are outlined above.  Medication changes, Labs and Tests ordered today are listed in the Patient Instructions below. Patient Instructions  Medication Instructions:  1) START Hydrochlorothiazide 12.5mg  once daily  Labwork: BMET at time of appointment with Hypertension Clinic.  Testing/Procedures: None  Follow-Up: Your physician recommends that you schedule a follow-up appointment in: 1 month with Hypertension Clinic.    Your physician wants you to follow-up in: 1 year with Dr. Tamala Julian. You will receive a reminder letter in the mail two months in advance. If you don't receive a letter, please call our office to schedule the follow-up appointment.   Any Other Special Instructions Will Be Listed Below (If Applicable).   Low-Sodium Eating Plan Sodium raises blood pressure and causes water to be held in the body. Getting less sodium from food will help lower your blood pressure, reduce any swelling, and protect your heart, liver, and kidneys. We get sodium by adding salt (sodium chloride) to food. Most of our sodium comes from canned, boxed, and frozen foods. Restaurant foods, fast foods, and pizza are also very high in  sodium. Even if you take medicine to lower your blood pressure or to reduce fluid in your body, getting less sodium from your food is important. What is my plan? Most people should limit their sodium intake to 2,300 mg a day. Your health care provider recommends that you limit your sodium intake to __________ a day. What do I need to know about this eating plan? For the low-sodium eating plan, you will follow these general guidelines:  Choose foods with a % Daily Value for sodium of less than 5% (as listed on the food label).  Use salt-free seasonings or herbs instead of table salt or sea salt.  Check with your health care provider or pharmacist before using salt substitutes.  Eat fresh foods.  Eat more vegetables and fruits.  Limit canned vegetables. If you do use them, rinse them well to decrease the sodium.  Limit cheese to 1 oz (28 g) per day.  Eat lower-sodium products, often labeled as "lower sodium" or "no salt added."  Avoid foods that contain monosodium glutamate (MSG). MSG is sometimes added to [place] food and some canned foods.  Check food labels (Nutrition Facts labels) on foods to learn how much sodium is in one serving.  Eat more home-cooked food and less restaurant, buffet, and fast food.  When eating at a restaurant, ask that your food be prepared with less salt, or no salt if possible. How do I read food labels for sodium information? The Nutrition Facts label lists the amount of sodium in one serving of the food. If you eat more than one serving, you must multiply the listed amount of sodium by the number of servings. Food labels may also identify foods as:  Sodium free-Less than 5 mg in a serving.  Very low sodium-35 mg or less in a serving.  Low sodium-140 mg or less in a serving.  Light in sodium-50% less sodium in a serving. For example, if a food that usually has 300 mg of sodium is changed to become light in sodium, it will have 150 mg of  sodium.  Reduced sodium-25%  less sodium in a serving. For example, if a food that usually has 400 mg of sodium is changed to reduced sodium, it will have 300 mg of sodium. What foods can I eat? Grains  Low-sodium cereals, including oats, puffed wheat and rice, and shredded wheat cereals. Low-sodium crackers. Unsalted rice and pasta. Lower-sodium bread. Vegetables  Frozen or fresh vegetables. Low-sodium or reduced-sodium canned vegetables. Low-sodium or reduced-sodium tomato sauce and paste. Low-sodium or reduced-sodium tomato and vegetable juices. Fruits  Fresh, frozen, and canned fruit. Fruit juice. Meat and Other Protein Products  Low-sodium canned tuna and salmon. Fresh or frozen meat, poultry, seafood, and fish. Lamb. Unsalted nuts. Dried beans, peas, and lentils without added salt. Unsalted canned beans. Homemade soups without salt. Eggs. Dairy  Milk. Soy milk. Ricotta cheese. Low-sodium or reduced-sodium cheeses. Yogurt. Condiments  Fresh and dried herbs and spices. Salt-free seasonings. Onion and garlic powders. Low-sodium varieties of mustard and ketchup. Fresh or refrigerated horseradish. Lemon juice. Fats and Oils  Reduced-sodium salad dressings. Unsalted butter. Other  Unsalted popcorn and pretzels. The items listed above may not be a complete list of recommended foods or beverages. Contact your dietitian for more options.  What foods are not recommended? Grains  Instant hot cereals. Bread stuffing, pancake, and biscuit mixes. Croutons. Seasoned rice or pasta mixes. Noodle soup cups. Boxed or frozen macaroni and cheese. Self-rising flour. Regular salted crackers. Vegetables  Regular canned vegetables. Regular canned tomato sauce and paste. Regular tomato and vegetable juices. Frozen vegetables in sauces. Salted [place] fries. Olives. Angie Fava. Relishes. Sauerkraut. Salsa. Meat and Other Protein Products  Salted, canned, smoked, spiced, or pickled meats, seafood, or fish. Bacon,  ham, sausage, hot dogs, corned beef, chipped beef, and packaged luncheon meats. Salt pork. Jerky. Pickled herring. Anchovies, regular canned tuna, and sardines. Salted nuts. Dairy  Processed cheese and cheese spreads. Cheese curds. Blue cheese and cottage cheese. Buttermilk. Condiments  Onion and garlic salt, seasoned salt, table salt, and sea salt. Canned and packaged gravies. Worcestershire sauce. Tartar sauce. Barbecue sauce. Teriyaki sauce. Soy sauce, including reduced sodium. Steak sauce. Fish sauce. Oyster sauce. Cocktail sauce. Horseradish that you find on the shelf. Regular ketchup and mustard. Meat flavorings and tenderizers. Bouillon cubes. Hot sauce. Tabasco sauce. Marinades. Taco seasonings. Relishes. Fats and Oils  Regular salad dressings. Salted butter. Margarine. Ghee. Bacon fat. Other  Potato and tortilla chips. Corn chips and puffs. Salted popcorn and pretzels. Canned or dried soups. Pizza. Frozen entrees and pot pies. The items listed above may not be a complete list of foods and beverages to avoid. Contact your dietitian for more information.  This information is not intended to replace advice given to you by your health care provider. Make sure you discuss any questions you have with your health care provider. Document Released: 10/15/2001 Document Revised: 10/01/2015 Document Reviewed: 02/27/2013 Elsevier Interactive Patient Education  2017 [employer].    If you need a refill on your cardiac medications before your next appointment, please call your pharmacy.      Signed, Sinclair Grooms, MD  06/14/2016 10:50 AM    [facility] [address], [place], [place]  [address] Phone: [phone]; Fax: [phone]

## 2016-06-14 NOTE — Patient Instructions (Signed)
Medication Instructions:  1) START Hydrochlorothiazide 12.5mg  once daily  Labwork: BMET at time of appointment with Hypertension Clinic.  Testing/Procedures: None  Follow-Up: Your physician recommends that you schedule a follow-up appointment in: 1 month with Hypertension Clinic.    Your physician wants you to follow-up in: 1 year with Dr. Tamala Julian. You will receive a reminder letter in the mail two months in advance. If you don't receive a letter, please call our office to schedule the follow-up appointment.   Any Other Special Instructions Will Be Listed Below (If Applicable).   Low-Sodium Eating Plan Sodium raises blood pressure and causes water to be held in the body. Getting less sodium from food will help lower your blood pressure, reduce any swelling, and protect your heart, liver, and kidneys. We get sodium by adding salt (sodium chloride) to food. Most of our sodium comes from canned, boxed, and frozen foods. Restaurant foods, fast foods, and pizza are also very high in sodium. Even if you take medicine to lower your blood pressure or to reduce fluid in your body, getting less sodium from your food is important. What is my plan? Most people should limit their sodium intake to 2,300 mg a day. Your health care provider recommends that you limit your sodium intake to __________ a day. What do I need to know about this eating plan? For the low-sodium eating plan, you will follow these general guidelines:  Choose foods with a % Daily Value for sodium of less than 5% (as listed on the food label).  Use salt-free seasonings or herbs instead of table salt or sea salt.  Check with your health care provider or pharmacist before using salt substitutes.  Eat fresh foods.  Eat more vegetables and fruits.  Limit canned vegetables. If you do use them, rinse them well to decrease the sodium.  Limit cheese to 1 oz (28 g) per day.  Eat lower-sodium products, often labeled as "lower  sodium" or "no salt added."  Avoid foods that contain monosodium glutamate (MSG). MSG is sometimes added to [place] food and some canned foods.  Check food labels (Nutrition Facts labels) on foods to learn how much sodium is in one serving.  Eat more home-cooked food and less restaurant, buffet, and fast food.  When eating at a restaurant, ask that your food be prepared with less salt, or no salt if possible. How do I read food labels for sodium information? The Nutrition Facts label lists the amount of sodium in one serving of the food. If you eat more than one serving, you must multiply the listed amount of sodium by the number of servings. Food labels may also identify foods as:  Sodium free-Less than 5 mg in a serving.  Very low sodium-35 mg or less in a serving.  Low sodium-140 mg or less in a serving.  Light in sodium-50% less sodium in a serving. For example, if a food that usually has 300 mg of sodium is changed to become light in sodium, it will have 150 mg of sodium.  Reduced sodium-25% less sodium in a serving. For example, if a food that usually has 400 mg of sodium is changed to reduced sodium, it will have 300 mg of sodium. What foods can I eat? Grains  Low-sodium cereals, including oats, puffed wheat and rice, and shredded wheat cereals. Low-sodium crackers. Unsalted rice and pasta. Lower-sodium bread. Vegetables  Frozen or fresh vegetables. Low-sodium or reduced-sodium canned vegetables. Low-sodium or reduced-sodium tomato sauce and paste. Low-sodium  or reduced-sodium tomato and vegetable juices. Fruits  Fresh, frozen, and canned fruit. Fruit juice. Meat and Other Protein Products  Low-sodium canned tuna and salmon. Fresh or frozen meat, poultry, seafood, and fish. Lamb. Unsalted nuts. Dried beans, peas, and lentils without added salt. Unsalted canned beans. Homemade soups without salt. Eggs. Dairy  Milk. Soy milk. Ricotta cheese. Low-sodium or reduced-sodium cheeses.  Yogurt. Condiments  Fresh and dried herbs and spices. Salt-free seasonings. Onion and garlic powders. Low-sodium varieties of mustard and ketchup. Fresh or refrigerated horseradish. Lemon juice. Fats and Oils  Reduced-sodium salad dressings. Unsalted butter. Other  Unsalted popcorn and pretzels. The items listed above may not be a complete list of recommended foods or beverages. Contact your dietitian for more options.  What foods are not recommended? Grains  Instant hot cereals. Bread stuffing, pancake, and biscuit mixes. Croutons. Seasoned rice or pasta mixes. Noodle soup cups. Boxed or frozen macaroni and cheese. Self-rising flour. Regular salted crackers. Vegetables  Regular canned vegetables. Regular canned tomato sauce and paste. Regular tomato and vegetable juices. Frozen vegetables in sauces. Salted [place] fries. Olives. Angie Fava. Relishes. Sauerkraut. Salsa. Meat and Other Protein Products  Salted, canned, smoked, spiced, or pickled meats, seafood, or fish. Bacon, ham, sausage, hot dogs, corned beef, chipped beef, and packaged luncheon meats. Salt pork. Jerky. Pickled herring. Anchovies, regular canned tuna, and sardines. Salted nuts. Dairy  Processed cheese and cheese spreads. Cheese curds. Blue cheese and cottage cheese. Buttermilk. Condiments  Onion and garlic salt, seasoned salt, table salt, and sea salt. Canned and packaged gravies. Worcestershire sauce. Tartar sauce. Barbecue sauce. Teriyaki sauce. Soy sauce, including reduced sodium. Steak sauce. Fish sauce. Oyster sauce. Cocktail sauce. Horseradish that you find on the shelf. Regular ketchup and mustard. Meat flavorings and tenderizers. Bouillon cubes. Hot sauce. Tabasco sauce. Marinades. Taco seasonings. Relishes. Fats and Oils  Regular salad dressings. Salted butter. Margarine. Ghee. Bacon fat. Other  Potato and tortilla chips. Corn chips and puffs. Salted popcorn and pretzels. Canned or dried soups. Pizza. Frozen entrees and  pot pies. The items listed above may not be a complete list of foods and beverages to avoid. Contact your dietitian for more information.  This information is not intended to replace advice given to you by your health care provider. Make sure you discuss any questions you have with your health care provider. Document Released: 10/15/2001 Document Revised: 10/01/2015 Document Reviewed: 02/27/2013 Elsevier Interactive Patient Education  2017 [employer].    If you need a refill on your cardiac medications before your next appointment, please call your pharmacy.

## 2016-07-04 ENCOUNTER — Ambulatory Visit: Payer: Commercial Managed Care - HMO | Admitting: Nurse Practitioner

## 2016-07-12 ENCOUNTER — Ambulatory Visit (INDEPENDENT_AMBULATORY_CARE_PROVIDER_SITE_OTHER): Payer: Medicare HMO | Admitting: Pharmacist

## 2016-07-12 ENCOUNTER — Other Ambulatory Visit (INDEPENDENT_AMBULATORY_CARE_PROVIDER_SITE_OTHER): Payer: Medicare HMO

## 2016-07-12 VITALS — BP 152/86 | HR 77

## 2016-07-12 DIAGNOSIS — I10 Essential (primary) hypertension: Secondary | ICD-10-CM | POA: Diagnosis not present

## 2016-07-12 DIAGNOSIS — I2510 Atherosclerotic heart disease of native coronary artery without angina pectoris: Secondary | ICD-10-CM

## 2016-07-12 DIAGNOSIS — I5032 Chronic diastolic (congestive) heart failure: Secondary | ICD-10-CM

## 2016-07-12 LAB — BASIC METABOLIC PANEL
BUN/Creatinine Ratio: 13 (ref 10–24)
BUN: 14 mg/dL (ref 8–27)
CALCIUM: 9.7 mg/dL (ref 8.6–10.2)
CO2: 24 mmol/L (ref 18–29)
CREATININE: 1.08 mg/dL (ref 0.76–1.27)
Chloride: 97 mmol/L (ref 96–106)
GFR calc Af Amer: 79 mL/min/{1.73_m2} (ref >59)
GFR calc non Af Amer: 69 mL/min/{1.73_m2} (ref >59)
GLUCOSE: 265 mg/dL — AB (ref 65–99)
POTASSIUM: 4.8 mmol/L (ref 3.5–5.2)
SODIUM: 140 mmol/L (ref 134–144)

## 2016-07-12 NOTE — Progress Notes (Signed)
Patient ID: Chad [name]                 DOB: May 06, 1946                      MRN: [id number]     HPI: Chad [name] is a 71 y.o. male referred by Dr. Tamala Julian to HTN clinic. PMH is significant for CAD, prior inferior MI in 2009 treated with BMS, DM, and HTN. At last visit 1 month ago, BP was elevated at 194/100 and pt was started on HCTZ 12.5mg  daily. He presents today for follow up.  Pt reports tolerating his HCTZ well. Denies dizziness, headache, blurred vision, or falls. He reports adherence to his medications except for lisinopril. He states he stopped taking this about a month ago because he felt that it was no longer working. Of note, his systolic BP was in the [id number] at his visit 1 month ago compared to 150s.   He checks his BP at home once a day in the morning when he wakes up around 9:30am. Most of his readings have ranged [id number] systolic. Occasionally he will have readings in the low 100s but upon recheck they are in the 140s. He uses a bicep cuff and has had it for approximately 2 years.  Current HTN meds: lisinopril 40mg  daily (pt stopped taking 1 month ago), HCTZ 12.5mg  daily, carvedilol 25mg  BID, Imdur 60mg  daily BP goal: <130/69mmHg  Family History: The patient's family history includes Cancer in his maternal uncle; Dementia in his father and mother; Diabetes Mellitus II in his mother; Healthy in his brother and sister; Heart disease in his father.  Social History: Former smoker 1/2 PPD for 50 years, quit 01/08/16. Denies alcohol and illicit drug use.  Diet: Drinks 2-3 cups of coffee each morning. Does not add any salt to his food.  Exercise: Walks most days a week for 30-45 minutes   Wt Readings from Last 3 Encounters:  06/14/16 186 lb 9.6 oz (84.6 kg)  12/31/15 190 lb (86.2 kg)  03/23/15 191 lb (86.6 kg)   BP Readings from Last 3 Encounters:  06/14/16 (!) 194/100  12/31/15 (!) 152/86  03/23/15 132/80   Pulse Readings from Last 3 Encounters:  06/14/16 68  12/31/15 74    03/23/15 65    Renal function: CrCl cannot be calculated (Patient's most recent lab result is older than the maximum 21 days allowed.).  Past Medical History:  Diagnosis Date  . Adenomatous colon polyp   . Benign hypertension   . BPH (benign prostatic hyperplasia)   . Coronary artery disease    s/p MI 10/2007; s/p RCA BMS; Cardiolite 02/2008=EF 51%  . Coronary atherosclerosis of native coronary artery   . Hyperlipidemia   . Inferior MI ([place]) 2009  . Kidney stones   . Memory difficulty 12/31/2015  . Pure hypercholesterolemia   . Smoker   . Syncopal episodes    Only 1 episode because of hypotension  . Type II or unspecified type diabetes mellitus without mention of complication, uncontrolled    diagnosed age 65    Current Outpatient Prescriptions on File Prior to Visit  Medication Sig Dispense Refill  . aspirin EC 81 MG tablet Take 1 tablet (81 mg total) by mouth daily. 90 tablet 3  . atorvastatin (LIPITOR) 40 MG tablet Take 1 tablet (40 mg total) by mouth daily. 90 tablet 0  . carvedilol (COREG) 25 MG tablet Take 1 tablet (25 mg total) by mouth 2 (  two) times daily with a meal. 180 tablet 3  . glipiZIDE (GLUCOTROL) 10 MG tablet Take 5 mg by mouth 2 (two) times daily before a meal.    . hydrochlorothiazide (MICROZIDE) 12.5 MG capsule Take 1 capsule (12.5 mg total) by mouth daily. 90 capsule 3  . isosorbide mononitrate (IMDUR) 60 MG 24 hr tablet Take 1 tablet (60 mg total) by mouth daily. 30 tablet 5  . lisinopril (PRINIVIL,ZESTRIL) 40 MG tablet Take 1 tablet (40 mg total) by mouth daily. 90 tablet 0  . metFORMIN (GLUCOPHAGE) 1000 MG tablet Take 1,000 mg by mouth 2 (two) times daily with a meal.    . Multiple Vitamin (MULTIVITAMIN WITH MINERALS) TABS Take 1 tablet by mouth daily. [facility]    . nitroGLYCERIN (NITROSTAT) 0.4 MG SL tablet Place 0.4 mg under the tongue every 5 (five) minutes as needed.    . pioglitazone (ACTOS) 45 MG tablet Take 45 mg by mouth daily.     No  current facility-administered medications on file prior to visit.     No Known Allergies   Assessment/Plan:  1. Hypertension - BP above goal <130/3mmHg despite initiating HCTZ. Will restart lisinopril given hx of DM and ACS event. Pt will restart at lisinopril 20mg  daily for 1 week, then increase dose back to 40mg  daily which he had previously been taking. Checking BMET today. Will f/u in clinic in 2 weeks.   Latonya Knight E. Aliveah Gallant, PharmD, CPP, [facility] [id number], [place], [place] [address] Phone: [phone]; Fax: [phone] 07/12/2016 10:34 AM

## 2016-07-12 NOTE — Patient Instructions (Signed)
Restart your lisinopril at 1/2 tablet a day (20mg ).  After 1 week, if your systolic (top) blood pressure reading is greater than 130 on a regular basis, increase your lisinopril back to the full tablet each day (40mg )  Continue your other medications  Write down your blood pressure readings  Follow up in clinic in 2 weeks

## 2016-07-13 ENCOUNTER — Telehealth: Payer: Self-pay | Admitting: Interventional Cardiology

## 2016-07-13 NOTE — Telephone Encounter (Signed)
New message    Patient calling back stating the nurse called his wife phone today - lab results

## 2016-07-13 NOTE — Telephone Encounter (Signed)
Informed pt of lab results. Pt verbalized understanding. 

## 2016-07-26 ENCOUNTER — Encounter: Payer: Self-pay | Admitting: Pharmacist

## 2016-07-26 ENCOUNTER — Ambulatory Visit (INDEPENDENT_AMBULATORY_CARE_PROVIDER_SITE_OTHER): Payer: Medicare HMO | Admitting: Pharmacist

## 2016-07-26 VITALS — BP 142/88 | HR 64

## 2016-07-26 DIAGNOSIS — I10 Essential (primary) hypertension: Secondary | ICD-10-CM

## 2016-07-26 NOTE — Patient Instructions (Addendum)
Return for a follow up appointment as scheduled in 1 year with Dr. Tamala Julian  Blood pressure goal less than 130/80   Check your blood pressure at home daily (if able) and keep record of the readings.  Take your BP meds as follows: Take Carvedilol 25mg  about 12 hours apart (morning and evening) Continue Hydrochlorothiazide 12.5mg  each morning  Lisinopril 40mg  each afternoon    Bring all of your meds, your BP cuff and your record of home blood pressures to your next appointment.  Exercise as you're able, try to walk approximately 30 minutes per day.  Keep salt intake to a minimum, especially watch canned and prepared boxed foods.  Eat more fresh fruits and vegetables and fewer canned items.  Avoid eating in fast food restaurants.    HOW TO TAKE YOUR BLOOD PRESSURE: . Rest 5 minutes before taking your blood pressure. .  Don't smoke or drink caffeinated beverages for at least 30 minutes before. . Take your blood pressure before (not after) you eat. . Sit comfortably with your back supported and both feet on the floor (don't cross your legs). . Elevate your arm to heart level on a table or a desk. . Use the proper sized cuff. It should fit smoothly and snugly around your bare upper arm. There should be enough room to slip a fingertip under the cuff. The bottom edge of the cuff should be 1 inch above the crease of the elbow. . Ideally, take 3 measurements at one sitting and record the average.

## 2016-07-26 NOTE — Progress Notes (Signed)
Patient ID: Chad [name]                 DOB: May 06, 1946                      MRN: [id number]     HPI: Chad [name] is a 71 y.o. male patient of Dr. Tamala Julian who presents today for hypertension follow up. PMH is significant for CAD, prior inferior MI in 2009 treated with BMS, DM, and HTN. At his most recent visit in HTN clinic about 2 weeks ago his lisinopril was restarted at 20mg  daily and he was instructed to increase to 40mg  if pressures remained >130/80.   Reports has been taking lisinopril 40mg  daily (for the last few days) and feels fine on medication. He states that his pressures have been decreasing over the last few days. On his log his pressures were 150s/80s-90s and have trended down to mostly 130s- low 140s/70s-80s.    Current HTN meds:  Lisinopril 40mg  daily Isosorbide mononitrate 60mg  daily Hydrochlorothiazide 12.5mg  daily Carvedilol 25mg  BID  BP goal: <130/65mmHg  Family History: The patient's family history includes Cancer in his maternal uncle; Dementia in his father and mother; Diabetes Mellitus II in his mother; Healthy in his brother and sister; Heart disease in his father.  Social History: Former smoker 1/2 PPD for 50 years, quit 01/08/16. Denies alcohol and illicit drug use.  Diet: Drinks 2-3 cups of coffee each morning. Does not add any salt to his food.  Exercise: Walks most days a week for 30-45 minutes  Home BP readings:  Pressures have been 130-140s/78-84  Wt Readings from Last 3 Encounters:  06/14/16 186 lb 9.6 oz (84.6 kg)  12/31/15 190 lb (86.2 kg)  03/23/15 191 lb (86.6 kg)   BP Readings from Last 3 Encounters:  07/26/16 (!) 142/88  07/12/16 (!) 152/86  06/14/16 (!) 194/100   Pulse Readings from Last 3 Encounters:  07/26/16 64  07/12/16 77  06/14/16 68    Renal function: CrCl cannot be calculated (Unknown ideal weight.).  Past Medical History:  Diagnosis Date  . Adenomatous colon polyp   . Benign hypertension   . BPH (benign  prostatic hyperplasia)   . Coronary artery disease    s/p MI 10/2007; s/p RCA BMS; Cardiolite 02/2008=EF 51%  . Coronary atherosclerosis of native coronary artery   . Hyperlipidemia   . Inferior MI ([place]) 2009  . Kidney stones   . Memory difficulty 12/31/2015  . Pure hypercholesterolemia   . Smoker   . Syncopal episodes    Only 1 episode because of hypotension  . Type II or unspecified type diabetes mellitus without mention of complication, uncontrolled    diagnosed age 46    Current Outpatient Prescriptions on File Prior to Visit  Medication Sig Dispense Refill  . aspirin EC 81 MG tablet Take 1 tablet (81 mg total) by mouth daily. 90 tablet 3  . atorvastatin (LIPITOR) 40 MG tablet Take 1 tablet (40 mg total) by mouth daily. 90 tablet 0  . carvedilol (COREG) 25 MG tablet Take 1 tablet (25 mg total) by mouth 2 (two) times daily with a meal. 180 tablet 3  . glipiZIDE (GLUCOTROL) 10 MG tablet Take 5 mg by mouth 2 (two) times daily before a meal.    . hydrochlorothiazide (MICROZIDE) 12.5 MG capsule Take 1 capsule (12.5 mg total) by mouth daily. 90 capsule 3  . isosorbide mononitrate (IMDUR) 60 MG 24 hr tablet Take 1 tablet (60  mg total) by mouth daily. 30 tablet 5  . lisinopril (PRINIVIL,ZESTRIL) 40 MG tablet Take 1 tablet (40 mg total) by mouth daily. 90 tablet 0  . metFORMIN (GLUCOPHAGE) 1000 MG tablet Take 1,000 mg by mouth 2 (two) times daily with a meal.    . Multiple Vitamin (MULTIVITAMIN WITH MINERALS) TABS Take 1 tablet by mouth daily. [facility]    . nitroGLYCERIN (NITROSTAT) 0.4 MG SL tablet Place 0.4 mg under the tongue every 5 (five) minutes as needed.    . pioglitazone (ACTOS) 45 MG tablet Take 45 mg by mouth daily.     No current facility-administered medications on file prior to visit.     No Known Allergies  Blood pressure (!) 142/88, pulse 64, SpO2 99 %.   Assessment/Plan: Hypertension: BP slightly above goal, but home pressures have trended down with increase  back to lisinopril 40mg  daily. He is tolerating his current regimen well. No medications changes today. Patient instructed to call clinic in 3-4 weeks if pressures consistently >130/80. Follow up with HTN clinic as needed and Dr. Tamala Julian as scheduled.    Thank you, Lelan Pons. Patterson Hammersmith, [facility]  07/26/2016 9:48 AM

## 2016-11-23 DIAGNOSIS — H5210 Myopia, unspecified eye: Secondary | ICD-10-CM | POA: Diagnosis not present

## 2017-02-06 DIAGNOSIS — Z23 Encounter for immunization: Secondary | ICD-10-CM | POA: Diagnosis not present

## 2017-02-06 DIAGNOSIS — Z1159 Encounter for screening for other viral diseases: Secondary | ICD-10-CM | POA: Diagnosis not present

## 2017-02-06 DIAGNOSIS — I2510 Atherosclerotic heart disease of native coronary artery without angina pectoris: Secondary | ICD-10-CM | POA: Diagnosis not present

## 2017-02-06 DIAGNOSIS — I10 Essential (primary) hypertension: Secondary | ICD-10-CM | POA: Diagnosis not present

## 2017-02-06 DIAGNOSIS — E119 Type 2 diabetes mellitus without complications: Secondary | ICD-10-CM | POA: Diagnosis not present

## 2017-02-06 DIAGNOSIS — Z79899 Other long term (current) drug therapy: Secondary | ICD-10-CM | POA: Diagnosis not present

## 2017-02-06 DIAGNOSIS — E7800 Pure hypercholesterolemia, unspecified: Secondary | ICD-10-CM | POA: Diagnosis not present

## 2017-02-06 DIAGNOSIS — Z0000 Encounter for general adult medical examination without abnormal findings: Secondary | ICD-10-CM | POA: Diagnosis not present

## 2017-02-06 DIAGNOSIS — I509 Heart failure, unspecified: Secondary | ICD-10-CM | POA: Diagnosis not present

## 2017-06-09 DIAGNOSIS — E119 Type 2 diabetes mellitus without complications: Secondary | ICD-10-CM | POA: Diagnosis not present

## 2017-07-05 DIAGNOSIS — I2510 Atherosclerotic heart disease of native coronary artery without angina pectoris: Secondary | ICD-10-CM | POA: Diagnosis not present

## 2017-07-05 DIAGNOSIS — E7800 Pure hypercholesterolemia, unspecified: Secondary | ICD-10-CM | POA: Diagnosis not present

## 2017-07-05 DIAGNOSIS — Z79899 Other long term (current) drug therapy: Secondary | ICD-10-CM | POA: Diagnosis not present

## 2017-07-05 DIAGNOSIS — I509 Heart failure, unspecified: Secondary | ICD-10-CM | POA: Diagnosis not present

## 2017-07-05 DIAGNOSIS — E119 Type 2 diabetes mellitus without complications: Secondary | ICD-10-CM | POA: Diagnosis not present

## 2017-07-05 DIAGNOSIS — I10 Essential (primary) hypertension: Secondary | ICD-10-CM | POA: Diagnosis not present

## 2017-07-05 DIAGNOSIS — F0390 Unspecified dementia without behavioral disturbance: Secondary | ICD-10-CM | POA: Diagnosis not present

## 2017-07-09 NOTE — Progress Notes (Signed)
Cardiology Office Note    Date:  07/10/2017   ID:  Chad [name], DOB Nov 08, 1945, MRN [id number]  PCP:  Orpah Melter, MD  Cardiologist: Sinclair Grooms, MD   Chief Complaint  Patient presents with  . Coronary Artery Disease    History of Present Illness:  Chad [name] is a 72 y.o. male who presents for CAD , prior inferior MI 2009 treated with bare-metal stent , subsequent total occlusion of the right coronary now with left right collaterals, chronic total occlusion of the mid LAD , left ventricular ejection fraction 35-40%. Other problems include diabetes mellitus, hypertension, an systolic heart failure improved and normal systolic function on medical therapy. Last EF was 55% by echo within the last several months   No cardiac complaints.  Specifically denies dyspnea, orthopnea, chest pain, and nitroglycerin use.  No claudication.  Has not had his medication yet today.  It is nearly noontime.  We discussed the impact of withholding antihypertensive therapy for 5 or 6 hours after he awakens, likely leading to higher than normal blood pressures during waking hours.   Past Medical History:  Diagnosis Date  . Adenomatous colon polyp   . Benign hypertension   . BPH (benign prostatic hyperplasia)   . Coronary artery disease    s/p MI 10/2007; s/p RCA BMS; Cardiolite 02/2008=EF 51%  . Coronary atherosclerosis of native coronary artery   . Hyperlipidemia   . Inferior MI ([place]) 2009  . Kidney stones   . Memory difficulty 12/31/2015  . Pure hypercholesterolemia   . Smoker   . Syncopal episodes    Only 1 episode because of hypotension  . Type II or unspecified type diabetes mellitus without mention of complication, uncontrolled    diagnosed age 56    Past Surgical History:  Procedure Laterality Date  . CORONARY STENT PLACEMENT  10/2007   Liberte stent to RCA (BMS)  . LEFT AND RIGHT HEART CATHETERIZATION WITH CORONARY ANGIOGRAM N/A 01/23/2014   Procedure: LEFT AND RIGHT HEART  CATHETERIZATION WITH CORONARY ANGIOGRAM;  Surgeon: Sinclair Grooms, MD;  Location[facility] CATH LAB;  Service: Cardiovascular;  Laterality: N/A;  . TRANSURETHRAL RESECTION OF PROSTATE      Current Medications: Outpatient Medications Prior to Visit  Medication Sig Dispense Refill  . aspirin EC 81 MG tablet Take 1 tablet (81 mg total) by mouth daily. 90 tablet 3  . atorvastatin (LIPITOR) 40 MG tablet Take 1 tablet (40 mg total) by mouth daily. 90 tablet 0  . carvedilol (COREG) 25 MG tablet Take 1 tablet (25 mg total) by mouth 2 (two) times daily with a meal. 180 tablet 3  . glipiZIDE (GLUCOTROL) 10 MG tablet Take 5 mg by mouth 2 (two) times daily before a meal.    . isosorbide mononitrate (IMDUR) 60 MG 24 hr tablet Take 1 tablet (60 mg total) by mouth daily. 30 tablet 5  . lisinopril (PRINIVIL,ZESTRIL) 40 MG tablet Take 1 tablet (40 mg total) by mouth daily. 90 tablet 0  . metFORMIN (GLUCOPHAGE) 1000 MG tablet Take 1,000 mg by mouth 2 (two) times daily with a meal.    . Multiple Vitamin (MULTIVITAMIN WITH MINERALS) TABS Take 1 tablet by mouth daily. [facility]    . nitroGLYCERIN (NITROSTAT) 0.4 MG SL tablet Place 0.4 mg under the tongue every 5 (five) minutes as needed.    . pioglitazone (ACTOS) 45 MG tablet Take 45 mg by mouth daily.    . hydrochlorothiazide (MICROZIDE) 12.5 MG capsule Take  1 capsule (12.5 mg total) by mouth daily. 90 capsule 3   No facility-administered medications prior to visit.      Allergies:   Hydrochlorothiazide   Social History   Socioeconomic History  . Marital status: Married    Spouse name: None  . Number of children: 4  . Years of education: HS  . Highest education level: None  Social Needs  . Financial resource strain: None  . Food insecurity - worry: None  . Food insecurity - inability: None  . Transportation needs - medical: None  . Transportation needs - non-medical: None  Occupational History  . Occupation: Retired  Tobacco Use  . Smoking  status: Former Smoker    Packs/day: 0.50    Years: 50.00    Pack years: 25.00    Types: Cigarettes    Last attempt to quit: 01/08/2016    Years since quitting: 1.5  . Smokeless tobacco: Never Used  Substance and Sexual Activity  . Alcohol use: No  . Drug use: No  . Sexual activity: Yes  Other Topics Concern  . None  Social History Narrative   Retired from [employer]. Married greater than 40 years . 4 children.   Right-handed   Caffeine: at least 3 cups of coffee per day     Family History:  The patient's family history includes Cancer in his maternal uncle; Dementia in his father and mother; Diabetes Mellitus II in his mother; Healthy in his brother and sister; Heart disease in his father.   ROS:   Please see the history of present illness.    None  All other systems reviewed and are negative.   PHYSICAL EXAM:   VS:  BP (!) 172/104   Pulse 74   Ht 6\' 1"  (1.854 m)   Wt 179 lb 12.8 oz (81.6 kg)   BMI 23.72 kg/m    GEN: Well nourished, well developed, in no acute distress  HEENT: normal  Neck: no JVD, carotid bruits, or masses Cardiac: RRR; no murmurs, rubs, or gallops,no edema  Respiratory:  clear to auscultation bilaterally, normal work of breathing GI: soft, nontender, nondistended, + BS MS: no deformity or atrophy  Skin: warm and dry, no rash Neuro:  Alert and Oriented x 3, Strength and sensation are intact Psych: euthymic mood, full affect  Wt Readings from Last 3 Encounters:  07/10/17 179 lb 12.8 oz (81.6 kg)  06/14/16 186 lb 9.6 oz (84.6 kg)  12/31/15 190 lb (86.2 kg)      Studies/Labs Reviewed:   EKG:  EKG not repeated  Recent Labs: 07/12/2016: BUN 14; Creatinine, Ser 1.08; Potassium 4.8; Sodium 140   Lipid Panel    Component Value Date/Time   CHOL 105 10/31/2011 0639   TRIG 126 10/31/2011 0639   HDL 26 (L) 10/31/2011 0639   CHOLHDL 4.0 10/31/2011 0639   VLDL 25 10/31/2011 0639   LDLCALC 54 10/31/2011 0639    Additional studies/ records that were  reviewed today include:  None    ASSESSMENT:    1. Coronary artery disease involving native coronary artery of native heart without angina pectoris   2. Chronic diastolic heart failure ([place])   3. Essential hypertension   4. Type 2 diabetes mellitus with vascular disease ([place])   5. Mixed hyperlipidemia      PLAN:  In order of problems listed above:  1. Stable without angina. 2. No evidence of volume overload. 3. Elevated blood pressure but is not yet had a.m. doses of Imdur,  ACE inhibitor, and carvedilol.  We made no adjustments in therapy today because there is a prior history of medication titration leading to syncope (possibly because we were titrating medication on blood pressures that were end of therapeutic dose range values).  He will come back to blood pressure clinic.  He should be an afternoon clinic.  If blood pressure greater than 140/90 mmHg, will need to have up titration of therapy.  HCTZ in the past has been associated with syncope. 4. Hemoglobin A1c less than 7 5. LDL target less than 70.  Will begin to monitor her blood pressure.  Clinically stable.  Follow-up in 1 year.  Blood pressure follow-up in clinic.  Change therapy administration time to early a.m. for carvedilol, lisinopril, and Imdur.  Blood pressure target should be less than 140/90 mmHg.  Prior history of syncope with blood pressure medication titration.     Medication Adjustments/Labs and Tests Ordered: Current medicines are reviewed at length with the patient today.  Concerns regarding medicines are outlined above.  Medication changes, Labs and Tests ordered today are listed in the Patient Instructions below. Patient Instructions  Medication Instructions:  1) Make sure you take your Imdur, Lisinopril and Carvedilol in the morning.  Labwork: None  Testing/Procedures: None  Follow-Up: Your physician recommends that you schedule a follow-up appointment in: 2 weeks with Hypertension Clinic.  Make  sure this is appointment is at least 2 hour after you take your medication.  We recommend an afternoon appointment.  Your physician wants you to follow-up in: 1 year with Dr. Tamala Julian.  You will receive a reminder letter in the mail two months in advance. If you don't receive a letter, please call our office to schedule the follow-up appointment.    Any Other Special Instructions Will Be Listed Below (If Applicable).     If you need a refill on your cardiac medications before your next appointment, please call your pharmacy.      Signed, Sinclair Grooms, MD  07/10/2017 12:25 PM    [facility] [place], [place], [place]  [address] Phone: [phone]; Fax: [phone]

## 2017-07-10 ENCOUNTER — Ambulatory Visit: Payer: Medicare HMO | Admitting: Interventional Cardiology

## 2017-07-10 ENCOUNTER — Encounter: Payer: Self-pay | Admitting: Interventional Cardiology

## 2017-07-10 VITALS — BP 172/104 | HR 74 | Ht 73.0 in | Wt 179.8 lb

## 2017-07-10 DIAGNOSIS — E1159 Type 2 diabetes mellitus with other circulatory complications: Secondary | ICD-10-CM | POA: Diagnosis not present

## 2017-07-10 DIAGNOSIS — I5032 Chronic diastolic (congestive) heart failure: Secondary | ICD-10-CM | POA: Diagnosis not present

## 2017-07-10 DIAGNOSIS — I10 Essential (primary) hypertension: Secondary | ICD-10-CM | POA: Diagnosis not present

## 2017-07-10 DIAGNOSIS — E782 Mixed hyperlipidemia: Secondary | ICD-10-CM

## 2017-07-10 DIAGNOSIS — I2510 Atherosclerotic heart disease of native coronary artery without angina pectoris: Secondary | ICD-10-CM | POA: Diagnosis not present

## 2017-07-10 NOTE — Patient Instructions (Signed)
Medication Instructions:  1) Make sure you take your Imdur, Lisinopril and Carvedilol in the morning.  Labwork: None  Testing/Procedures: None  Follow-Up: Your physician recommends that you schedule a follow-up appointment in: 2 weeks with Hypertension Clinic.  Make sure this is appointment is at least 2 hour after you take your medication.  We recommend an afternoon appointment.  Your physician wants you to follow-up in: 1 year with Dr. Tamala Julian.  You will receive a reminder letter in the mail two months in advance. If you don't receive a letter, please call our office to schedule the follow-up appointment.    Any Other Special Instructions Will Be Listed Below (If Applicable).     If you need a refill on your cardiac medications before your next appointment, please call your pharmacy.

## 2017-07-27 ENCOUNTER — Ambulatory Visit (INDEPENDENT_AMBULATORY_CARE_PROVIDER_SITE_OTHER): Payer: Medicare HMO | Admitting: Pharmacist

## 2017-07-27 VITALS — BP 150/92 | HR 69

## 2017-07-27 DIAGNOSIS — I10 Essential (primary) hypertension: Secondary | ICD-10-CM

## 2017-07-27 MED ORDER — AMLODIPINE BESYLATE 5 MG PO TABS: 5.0000 mg | ORAL_TABLET | Freq: Every day | ORAL | 3 refills | Status: DC

## 2017-07-27 NOTE — Patient Instructions (Addendum)
Start taking amlodipine 5mg  once a day for your blood pressure  Continue taking your other medications  Check your blood pressure at home and record your readings  Follow up in clinic in 5 weeks to recheck your blood pressure. Take your lisinopril at 12pm before you come in to your visit  Call Tedric Leeth, pharmacist, with any concerns or low BP readings [phone]

## 2017-07-27 NOTE — Progress Notes (Signed)
Patient ID: Chad [name]                 DOB: April 22, 1946                      MRN: [id number]     HPI: Chad [name] is a 72 y.o. male referred by Dr. Tamala Julian to HTN clinic. PMH is significant for CAD, prior inferior MI in 2009 treated with BMS, DM, and HTN. Pt was seen in HTN clinic in 2018 and BP readings were controlled on lisinopril, HCTZ, and carvedilol. He was seen by Dr Tamala Julian 3 weeks ago and BP was elevated at 172/104 since pt had not taken his medications yet. No medication changes were made and pt presents to clinic for follow up.  Patient presents today in good spirits. He reports adherence to his medications and no adverse effects. He stopped taking his HCTZ at some point last year due to low BP that caused him to faint. He has been checking his BP at home occasionally, BP this morning was 142/80. He uses a bicep cuff and has had it for approximately 3 years. He has not taken his lisinopril today although he usually does at 12pm. He states he does not recall being advised to take his medication before this visit. He has had 1 cup of coffee so far today. He does not add salt to his food.  Current HTN meds: lisinopril 40mg  daily, carvedilol 25mg  BID, Imdur 60mg  daily BP goal: <130/72mmHg  Family History: The patient's family history includes Cancer in his maternal uncle; Dementia in his father and mother; Diabetes Mellitus II in his mother; Healthy in his brother and sister; Heart disease in his father.  Social History: Former smoker 1/2 PPD for 50 years, quit 01/08/16. Denies alcohol and illicit drug use.  Diet: Drinks 2-3 cups of coffee each morning. Does not add any salt to his food.  Exercise: Walks most days a week for 30-45 minutes   Wt Readings from Last 3 Encounters:  07/10/17 179 lb 12.8 oz (81.6 kg)  06/14/16 186 lb 9.6 oz (84.6 kg)  12/31/15 190 lb (86.2 kg)   BP Readings from Last 3 Encounters:  07/10/17 (!) 172/104  07/26/16 (!) 142/88  07/12/16 (!) 152/86   Pulse  Readings from Last 3 Encounters:  07/10/17 74  07/26/16 64  07/12/16 77    Renal function: CrCl cannot be calculated (Patient's most recent lab result is older than the maximum 21 days allowed.).  Past Medical History:  Diagnosis Date  . Adenomatous colon polyp   . Benign hypertension   . BPH (benign prostatic hyperplasia)   . Coronary artery disease    s/p MI 10/2007; s/p RCA BMS; Cardiolite 02/2008=EF 51%  . Coronary atherosclerosis of native coronary artery   . Hyperlipidemia   . Inferior MI ([place]) 2009  . Kidney stones   . Memory difficulty 12/31/2015  . Pure hypercholesterolemia   . Smoker   . Syncopal episodes    Only 1 episode because of hypotension  . Type II or unspecified type diabetes mellitus without mention of complication, uncontrolled    diagnosed age 66    Current Outpatient Medications on File Prior to Visit  Medication Sig Dispense Refill  . aspirin EC 81 MG tablet Take 1 tablet (81 mg total) by mouth daily. 90 tablet 3  . atorvastatin (LIPITOR) 40 MG tablet Take 1 tablet (40 mg total) by mouth daily. 90 tablet 0  . carvedilol (  COREG) 25 MG tablet Take 1 tablet (25 mg total) by mouth 2 (two) times daily with a meal. 180 tablet 3  . glipiZIDE (GLUCOTROL) 10 MG tablet Take 5 mg by mouth 2 (two) times daily before a meal.    . isosorbide mononitrate (IMDUR) 60 MG 24 hr tablet Take 1 tablet (60 mg total) by mouth daily. 30 tablet 5  . lisinopril (PRINIVIL,ZESTRIL) 40 MG tablet Take 1 tablet (40 mg total) by mouth daily. 90 tablet 0  . metFORMIN (GLUCOPHAGE) 1000 MG tablet Take 1,000 mg by mouth 2 (two) times daily with a meal.    . Multiple Vitamin (MULTIVITAMIN WITH MINERALS) TABS Take 1 tablet by mouth daily. [facility]    . nitroGLYCERIN (NITROSTAT) 0.4 MG SL tablet Place 0.4 mg under the tongue every 5 (five) minutes as needed.    . pioglitazone (ACTOS) 45 MG tablet Take 45 mg by mouth daily.     No current facility-administered medications on file  prior to visit.     Allergies  Allergen Reactions  . Hydrochlorothiazide Other (See Comments)    Syncope     Assessment/Plan:  1. Hypertension - BP above goal <130/36mmHg although pt has not taken his lisinopril yet today as instructed at previous visit. Will start amlodipine 5mg  daily and continue lisinopril 40mg  daily and carvedilol 25mg  BID. F/u in HTN clinic in 5 weeks (amlodipine will take 1 week to be delivered via mail order per pt preference).   Jonetta Dagley E. Deno Sida, PharmD, CPP, [facility] [address] [place], [place] [address] Phone: [phone]; Fax: [phone] 07/27/2017 7:43 AM

## 2017-08-16 DIAGNOSIS — I10 Essential (primary) hypertension: Secondary | ICD-10-CM | POA: Diagnosis not present

## 2017-08-16 DIAGNOSIS — E1165 Type 2 diabetes mellitus with hyperglycemia: Secondary | ICD-10-CM | POA: Diagnosis not present

## 2017-08-31 ENCOUNTER — Ambulatory Visit (INDEPENDENT_AMBULATORY_CARE_PROVIDER_SITE_OTHER): Payer: Medicare HMO | Admitting: Pharmacist

## 2017-08-31 VITALS — BP 136/86 | HR 65

## 2017-08-31 DIAGNOSIS — I10 Essential (primary) hypertension: Secondary | ICD-10-CM

## 2017-08-31 MED ORDER — AMLODIPINE BESYLATE 10 MG PO TABS: 10.0000 mg | ORAL_TABLET | Freq: Every day | ORAL | 3 refills | Status: AC

## 2017-08-31 NOTE — Progress Notes (Signed)
Patient ID: Chad [name]                 DOB: November 26, 1945                      MRN: [id number]     HPI: Chad [name] is a 72 y.o. male referred by Dr. Tamala Julian to HTN clinic. PMH is significant for CAD, prior inferior MI in 2009 treated with BMS, DM, and HTN. Pt was seen in HTN clinic in 2018 and BP readings were controlled on lisinopril, HCTZ, and carvedilol. At last visit, pt was started on amlodipine 5mg  daily.  Patient presents today in good spirits. He reports adherence to his medications and no adverse effects. Denies LEE with amlodipine start. He stopped taking his HCTZ at some point last year due to low BP that caused him to faint. He has been checking his BP at home each day and BP readings have improved to 270-350 systolic. He uses a bicep cuff and has had it for approximately 3 years. He has had 1 cup of coffee so far today. He does not add salt to his food. He has not taken any of his blood pressure medication yet today although he was previously advised twice to take his morning BP medications prior to HTN visits.  Current HTN meds: amlodipine 5mg  daily, lisinopril 40mg  daily, carvedilol 25mg  BID, Imdur 60mg  daily Previously tried: HCTZ - hypotension BP goal: <130/45mmHg  Family History: The patient's family history includes Cancer in his maternal uncle; Dementia in his father and mother; Diabetes Mellitus II in his mother; Healthy in his brother and sister; Heart disease in his father.  Social History: Former smoker 1/2 PPD for 50 years, quit 01/08/16. Denies alcohol and illicit drug use.  Diet: Drinks 2-3 cups of coffee each morning. Does not add any salt to his food.  Exercise: Walks most days a week for 30-45 minutes  BP readings: 120-140/70-80  Wt Readings from Last 3 Encounters:  07/10/17 179 lb 12.8 oz (81.6 kg)  06/14/16 186 lb 9.6 oz (84.6 kg)  12/31/15 190 lb (86.2 kg)   BP Readings from Last 3 Encounters:  07/27/17 (!) 150/92  07/10/17 (!) 172/104  07/26/16 (!)  142/88   Pulse Readings from Last 3 Encounters:  07/27/17 69  07/10/17 74  07/26/16 64    Renal function: CrCl cannot be calculated (Patient's most recent lab result is older than the maximum 21 days allowed.).  Past Medical History:  Diagnosis Date  . Adenomatous colon polyp   . Benign hypertension   . BPH (benign prostatic hyperplasia)   . Coronary artery disease    s/p MI 10/2007; s/p RCA BMS; Cardiolite 02/2008=EF 51%  . Coronary atherosclerosis of native coronary artery   . Hyperlipidemia   . Inferior MI ([place]) 2009  . Kidney stones   . Memory difficulty 12/31/2015  . Pure hypercholesterolemia   . Smoker   . Syncopal episodes    Only 1 episode because of hypotension  . Type II or unspecified type diabetes mellitus without mention of complication, uncontrolled    diagnosed age 60    Current Outpatient Medications on File Prior to Visit  Medication Sig Dispense Refill  . amLODipine (NORVASC) 5 MG tablet Take 1 tablet (5 mg total) by mouth daily. 90 tablet 3  . aspirin EC 81 MG tablet Take 1 tablet (81 mg total) by mouth daily. 90 tablet 3  . atorvastatin (LIPITOR) 40 MG tablet Take 1  tablet (40 mg total) by mouth daily. 90 tablet 0  . carvedilol (COREG) 25 MG tablet Take 1 tablet (25 mg total) by mouth 2 (two) times daily with a meal. 180 tablet 3  . glipiZIDE (GLUCOTROL) 10 MG tablet Take 5 mg by mouth 2 (two) times daily before a meal.    . isosorbide mononitrate (IMDUR) 60 MG 24 hr tablet Take 1 tablet (60 mg total) by mouth daily. 30 tablet 5  . lisinopril (PRINIVIL,ZESTRIL) 40 MG tablet Take 1 tablet (40 mg total) by mouth daily. 90 tablet 0  . metFORMIN (GLUCOPHAGE) 1000 MG tablet Take 1,000 mg by mouth 2 (two) times daily with a meal.    . Multiple Vitamin (MULTIVITAMIN WITH MINERALS) TABS Take 1 tablet by mouth daily. [facility]    . nitroGLYCERIN (NITROSTAT) 0.4 MG SL tablet Place 0.4 mg under the tongue every 5 (five) minutes as needed.    . pioglitazone  (ACTOS) 45 MG tablet Take 45 mg by mouth daily.     No current facility-administered medications on file prior to visit.     Allergies  Allergen Reactions  . Hydrochlorothiazide Other (See Comments)    Syncope     Assessment/Plan:  1. Hypertension - BP is much improved, however remains above goal <130/69mmHg. Pt has not taken any of his BP medications this morning as instructed at multiple previous visits. Will increase amlodipine to 10mg  daily and continue lisinopril 40mg  daily and carvedilol 25mg  BID. F/u in HTN clinic in 4 weeks. Again advised patient to take his morning BP medications prior to afternoon HTN visit to assess more accurate effects of BP medications.   Megan E. Supple, PharmD, CPP, [facility] [address], [place], [place] [address] Phone: [phone]; Fax: [phone] 08/31/2017 8:20 AM

## 2017-08-31 NOTE — Patient Instructions (Signed)
Increase your amlodipine from 5mg  to 10mg  daily  Continue taking your other medications  Continue to monitor and record your blood pressure readings  Follow up in clinic in 4 weeks for blood pressure check  Take your blood pressure medications in the morning before you come in for your appointment

## 2017-10-05 ENCOUNTER — Ambulatory Visit (INDEPENDENT_AMBULATORY_CARE_PROVIDER_SITE_OTHER): Payer: Medicare HMO | Admitting: Pharmacist

## 2017-10-05 VITALS — BP 134/70 | HR 90

## 2017-10-05 DIAGNOSIS — I10 Essential (primary) hypertension: Secondary | ICD-10-CM | POA: Diagnosis not present

## 2017-10-05 NOTE — Patient Instructions (Addendum)
Call [facility] when you need your amlodipine 10mg  refilled  Take your carvedilol at 11am and 11pm  Continue taking all of your medications  Follow up in blood pressure clinic as needed - your blood pressure goal is < 130/89mmHg

## 2017-10-05 NOTE — Progress Notes (Signed)
Patient ID: Chad [name]                 DOB: 28-Nov-1945                      MRN: [id number]     HPI: Chad [name] is a 72 y.o. male referred by Dr. Tamala Julian to HTN clinic. PMH is significant for CAD, prior inferior MI in 2009 treated with BMS, DM, and HTN. Pt was seen in HTN clinic in 2018 and BP readings were controlled on lisinopril, HCTZ, and carvedilol. At last visit, amlodipine was increased to 10mg  daily. Pt was advised to take BP medications prior to visit today.  Patient presents today in good spirits. He reports adherence to his medications and no adverse effects. Denies LEE with recent dose increase of amlodipine. He stopped taking his HCTZ at some point last year due to low BP that caused him to faint. His home BP readings have been well controlled ranging 542-706C systolic. He uses a bicep cuff and has had it for approximately 3 years. He has had 1 cup of coffee so far today. He does not add salt to his food. He took his amlodipine this morning however has not taken his carvedilol yet because he was not sure if he could take this with his other medications. HR typically in the 60-70s however elevated at 90 today. He has been taking carvedilol at 12pm and 7pm. Discussed the need for spacing doses apart by 12 hours.  Current HTN meds: amlodipine 10mg  daily (AM), lisinopril 40mg  daily (PM), carvedilol 25mg  BID, Imdur 60mg  daily Previously tried: HCTZ - hypotension BP goal: <130/61mmHg  Family History: The patient's family history includes Cancer in his maternal uncle; Dementia in his father and mother; Diabetes Mellitus II in his mother; Healthy in his brother and sister; Heart disease in his father.  Social History: Former smoker 1/2 PPD for 50 years, quit 01/08/16. Denies alcohol and illicit drug use.  Diet: Drinks 2-3 cups of coffee each morning. Does not add any salt to his food.  Exercise: Walks most days a week for 30-45 minutes  BP readings: 2 readings of 142, otherwise in 376E,  one systolic of 831 and 517.  Wt Readings from Last 3 Encounters:  07/10/17 179 lb 12.8 oz (81.6 kg)  06/14/16 186 lb 9.6 oz (84.6 kg)  12/31/15 190 lb (86.2 kg)   BP Readings from Last 3 Encounters:  08/31/17 136/86  07/27/17 (!) 150/92  07/10/17 (!) 172/104   Pulse Readings from Last 3 Encounters:  08/31/17 65  07/27/17 69  07/10/17 74    Renal function: CrCl cannot be calculated (Patient's most recent lab result is older than the maximum 21 days allowed.).  Past Medical History:  Diagnosis Date  . Adenomatous colon polyp   . Benign hypertension   . BPH (benign prostatic hyperplasia)   . Coronary artery disease    s/p MI 10/2007; s/p RCA BMS; Cardiolite 02/2008=EF 51%  . Coronary atherosclerosis of native coronary artery   . Hyperlipidemia   . Inferior MI ([place]) 2009  . Kidney stones   . Memory difficulty 12/31/2015  . Pure hypercholesterolemia   . Smoker   . Syncopal episodes    Only 1 episode because of hypotension  . Type II or unspecified type diabetes mellitus without mention of complication, uncontrolled    diagnosed age 60    Current Outpatient Medications on File Prior to Visit  Medication Sig Dispense Refill  .  amLODipine (NORVASC) 10 MG tablet Take 1 tablet (10 mg total) by mouth daily. 90 tablet 3  . aspirin EC 81 MG tablet Take 1 tablet (81 mg total) by mouth daily. 90 tablet 3  . atorvastatin (LIPITOR) 40 MG tablet Take 1 tablet (40 mg total) by mouth daily. 90 tablet 0  . carvedilol (COREG) 25 MG tablet Take 1 tablet (25 mg total) by mouth 2 (two) times daily with a meal. 180 tablet 3  . dapagliflozin propanediol (FARXIGA) 5 MG TABS tablet Take 5 mg by mouth daily.    Marland Kitchen glipiZIDE (GLUCOTROL) 10 MG tablet Take 5 mg by mouth 2 (two) times daily before a meal.    . isosorbide mononitrate (IMDUR) 60 MG 24 hr tablet Take 1 tablet (60 mg total) by mouth daily. 30 tablet 5  . lisinopril (PRINIVIL,ZESTRIL) 40 MG tablet Take 1 tablet (40 mg total) by mouth daily.  90 tablet 0  . metFORMIN (GLUCOPHAGE) 1000 MG tablet Take 1,000 mg by mouth 2 (two) times daily with a meal.    . Multiple Vitamin (MULTIVITAMIN WITH MINERALS) TABS Take 1 tablet by mouth daily. [facility]    . nitroGLYCERIN (NITROSTAT) 0.4 MG SL tablet Place 0.4 mg under the tongue every 5 (five) minutes as needed.    . pioglitazone (ACTOS) 45 MG tablet Take 45 mg by mouth daily.     No current facility-administered medications on file prior to visit.     Allergies  Allergen Reactions  . Hydrochlorothiazide Other (See Comments)    Syncope     Assessment/Plan:  1. Hypertension - BP very close to goal <130/31mmHg today and pt has not yet taken his AM dose of carvedilol. Home BP readings are controlled as well. Will continue amlodipine 10mg  daily, lisinopril 40mg  daily and carvedilol 25mg  BID. Advised pt to continue to monitor his BP at home and contact clinic with any concerns. F/u in HTN clinic as needed.   Joory Gough E. Armany Mano, PharmD, CPP, [facility] [address] [place], [place] [address] Phone: [phone]; Fax: [phone] 10/05/2017 12:07 PM

## 2017-11-28 DIAGNOSIS — E119 Type 2 diabetes mellitus without complications: Secondary | ICD-10-CM | POA: Diagnosis not present

## 2017-12-05 DIAGNOSIS — I10 Essential (primary) hypertension: Secondary | ICD-10-CM | POA: Diagnosis not present

## 2017-12-05 DIAGNOSIS — I2510 Atherosclerotic heart disease of native coronary artery without angina pectoris: Secondary | ICD-10-CM | POA: Diagnosis not present

## 2017-12-05 DIAGNOSIS — I509 Heart failure, unspecified: Secondary | ICD-10-CM | POA: Diagnosis not present

## 2017-12-05 DIAGNOSIS — Z7984 Long term (current) use of oral hypoglycemic drugs: Secondary | ICD-10-CM | POA: Diagnosis not present

## 2017-12-05 DIAGNOSIS — E7800 Pure hypercholesterolemia, unspecified: Secondary | ICD-10-CM | POA: Diagnosis not present

## 2017-12-05 DIAGNOSIS — F0390 Unspecified dementia without behavioral disturbance: Secondary | ICD-10-CM | POA: Diagnosis not present

## 2017-12-05 DIAGNOSIS — E1159 Type 2 diabetes mellitus with other circulatory complications: Secondary | ICD-10-CM | POA: Diagnosis not present

## 2017-12-05 DIAGNOSIS — F17210 Nicotine dependence, cigarettes, uncomplicated: Secondary | ICD-10-CM | POA: Diagnosis not present

## 2018-06-07 DIAGNOSIS — I2510 Atherosclerotic heart disease of native coronary artery without angina pectoris: Secondary | ICD-10-CM | POA: Diagnosis not present

## 2018-06-07 DIAGNOSIS — I10 Essential (primary) hypertension: Secondary | ICD-10-CM | POA: Diagnosis not present

## 2018-06-07 DIAGNOSIS — E7800 Pure hypercholesterolemia, unspecified: Secondary | ICD-10-CM | POA: Diagnosis not present

## 2018-06-07 DIAGNOSIS — Z23 Encounter for immunization: Secondary | ICD-10-CM | POA: Diagnosis not present

## 2018-06-07 DIAGNOSIS — I509 Heart failure, unspecified: Secondary | ICD-10-CM | POA: Diagnosis not present

## 2018-06-07 DIAGNOSIS — Z79899 Other long term (current) drug therapy: Secondary | ICD-10-CM | POA: Diagnosis not present

## 2018-06-07 DIAGNOSIS — E1159 Type 2 diabetes mellitus with other circulatory complications: Secondary | ICD-10-CM | POA: Diagnosis not present

## 2018-07-24 ENCOUNTER — Ambulatory Visit: Payer: Medicare HMO | Admitting: Interventional Cardiology

## 2018-08-29 ENCOUNTER — Telehealth: Payer: Self-pay

## 2018-08-29 NOTE — Progress Notes (Signed)
Virtual Visit via Video Note   This visit type was conducted due to national recommendations for restrictions regarding the COVID-19 Pandemic (e.g. social distancing) in an effort to limit this patient's exposure and mitigate transmission in our community.  Due to his co-morbid illnesses, this patient is at least at moderate risk for complications without adequate follow up.  This format is felt to be most appropriate for this patient at this time.  All issues noted in this document were discussed and addressed.  A limited physical exam was performed with this format.  Please refer to the patient's chart for his consent to telehealth for [facility].   Evaluation Performed:  Follow-up visit  Date:  08/30/2018   ID:  Chad [name], DOB 1945/09/20, MRN [id number]  Patient Location: Home Provider Location: Office  PCP:  Orpah Melter, MD  Cardiologist:  Sinclair Grooms, MD  Electrophysiologist:  None   Chief Complaint:  CAD  History of Present Illness:    Chad [name] is a 73 y.o. male with CAD , prior inferior MI 2009 treated with bare-metal stent , subsequent total occlusion of the right coronary now with left right collaterals, chronic total occlusion of the mid LAD , left ventricular ejection fraction 35-40%. Other problems include diabetes mellitus, hypertension, an systolic heart failure improved and normal systolic function on medical therapy. Last EF was 55% by echo 2016.  Chad [name] is PCP.  The patient does not have symptoms concerning for COVID-19 infection (fever, chills, cough, or new shortness of breath).   [name] voices no complaints.  This video visit is also done in the presence of his wife.  He denies dyspnea, chest pain, leg swelling, claudication, syncope, and palpitations.  No transient neurological complaints.  Not exercising as much as he would like.  There is concern about blood sugar control.  His medication regimen is unchanged.  He is on  dapagliflozin.  Past Medical History:  Diagnosis Date   Adenomatous colon polyp    Benign hypertension    BPH (benign prostatic hyperplasia)    Coronary artery disease    s/p MI 10/2007; s/p RCA BMS; Cardiolite 02/2008=EF 51%   Coronary atherosclerosis of native coronary artery    Hyperlipidemia    Inferior MI ([place]) 2009   Kidney stones    Memory difficulty 12/31/2015   Pure hypercholesterolemia    Smoker    Syncopal episodes    Only 1 episode because of hypotension   Type II or unspecified type diabetes mellitus without mention of complication, uncontrolled    diagnosed age 49   Past Surgical History:  Procedure Laterality Date   CORONARY STENT PLACEMENT  10/2007   Liberte stent to RCA (BMS)   LEFT AND RIGHT HEART CATHETERIZATION WITH CORONARY ANGIOGRAM N/A 01/23/2014   Procedure: LEFT AND RIGHT HEART CATHETERIZATION WITH CORONARY ANGIOGRAM;  Surgeon: Sinclair Grooms, MD;  [facility] LAB;  Service: Cardiovascular;  Laterality: N/A;   TRANSURETHRAL RESECTION OF PROSTATE       Current Meds  Medication Sig   amLODipine (NORVASC) 10 MG tablet Take 1 tablet (10 mg total) by mouth daily.   atorvastatin (LIPITOR) 40 MG tablet Take 1 tablet (40 mg total) by mouth daily.   carvedilol (COREG) 25 MG tablet Take 1 tablet (25 mg total) by mouth 2 (two) times daily with a meal.   dapagliflozin propanediol (FARXIGA) 5 MG TABS tablet Take 5 mg by mouth daily.   glipiZIDE (GLUCOTROL) 10 MG tablet Take 5  mg by mouth 2 (two) times daily before a meal.   isosorbide mononitrate (IMDUR) 60 MG 24 hr tablet Take 1 tablet (60 mg total) by mouth daily.   lisinopril (PRINIVIL,ZESTRIL) 40 MG tablet Take 1 tablet (40 mg total) by mouth daily.   metFORMIN (GLUCOPHAGE) 1000 MG tablet Take 1,000 mg by mouth 2 (two) times daily with a meal.   Multiple Vitamin (MULTIVITAMIN WITH MINERALS) TABS Take 1 tablet by mouth daily. [facility]   nitroGLYCERIN (NITROSTAT) 0.4 MG  SL tablet Place 0.4 mg under the tongue every 5 (five) minutes as needed.   pioglitazone (ACTOS) 45 MG tablet Take 45 mg by mouth daily.     Allergies:   Hydrochlorothiazide   Social History   Tobacco Use   Smoking status: Former Smoker    Packs/day: 0.50    Years: 50.00    Pack years: 25.00    Types: Cigarettes    Last attempt to quit: 01/08/2016    Years since quitting: 2.6   Smokeless tobacco: Never Used  Substance Use Topics   Alcohol use: No   Drug use: No     Family Hx: The patient's family history includes Cancer in his maternal uncle; Dementia in his father and mother; Diabetes Mellitus II in his mother; Healthy in his brother and sister; Heart disease in his father.  ROS:   Please see the history of present illness.    Denies claudication, edema, back pain, and stroke symptoms. All other systems reviewed and are negative.   Prior CV studies:   The following studies were reviewed today:  No recent cardiac functional or imaging data.  Last LVEF was 50%, having recovered from EF of 35%.  The most recent study was 2016.  Labs/Other Tests and Data Reviewed:    EKG:  No ECG reviewed.  Recent Labs: No results found for requested labs within last 8760 hours.    Most recent A1c was greater than 10.  Recent Lipid Panel Lab Results  Component Value Date/Time   CHOL 105 10/31/2011 06:39 AM   TRIG 126 10/31/2011 06:39 AM   HDL 26 (L) 10/31/2011 06:39 AM   CHOLHDL 4.0 10/31/2011 06:39 AM   LDLCALC 54 10/31/2011 06:39 AM    Wt Readings from Last 3 Encounters:  08/30/18 168 lb (76.2 kg)  07/10/17 179 lb 12.8 oz (81.6 kg)  06/14/16 186 lb 9.6 oz (84.6 kg)     Objective:    Vital Signs:  BP 130/62    Ht 6\' 1"  (1.854 m)    Wt 168 lb (76.2 kg)    BMI 22.16 kg/m    VITAL SIGNS:  reviewed GEN:  no acute distress NEURO:  alert and oriented x 3, no obvious focal deficit  ASSESSMENT & PLAN:    1. Coronary artery disease involving native coronary artery of  native heart without angina pectoris   2. Chronic diastolic heart failure ([place])   3. Essential hypertension   4. Type 2 diabetes mellitus with vascular disease ([place])   5. Mixed hyperlipidemia   6. Educated About Covid-19 Virus Infection    PLAN:  1. Secondary prevention was discussed in detail.  Please see below. 2. No evidence of volume overload.  We will reassess LV systolic function given diabetes and poor control to exclude silent systolic dysfunction. 3. Blood pressure target was discussed.  Low-salt diet discussed. 4. According to patient's wife, last hemoglobin A1c was greater than 10.  He has been referred to endocrinology. 5. LDL  target should be less than 70.  We will get most recent laboratory data from Dr. Doyle Askew.  Overall education and awareness concerning primary/secondary risk prevention was discussed in detail: LDL less than 70, hemoglobin A1c less than 7, blood pressure target less than 130/80 mmHg, >150 minutes of moderate aerobic activity per week, avoidance of smoking, weight control (via diet and exercise), and continued surveillance/management of/for obstructive sleep apnea.   COVID-19 Education: The signs and symptoms of COVID-19 were discussed with the patient and how to seek care for testing (follow up with PCP or arrange E-visit).  The importance of social distancing was discussed today.  Time:   Today, I have spent 15 minutes with the patient with telehealth technology discussing the above problems.     Medication Adjustments/Labs and Tests Ordered: Current medicines are reviewed at length with the patient today.  Concerns regarding medicines are outlined above.   Tests Ordered: No orders of the defined types were placed in this encounter.   Medication Changes: No orders of the defined types were placed in this encounter.   Disposition:  Follow up in 6 month(s)  Signed, Sinclair Grooms, MD  08/30/2018 12:48 PM    [facility]

## 2018-08-29 NOTE — Telephone Encounter (Signed)
YOUR CARDIOLOGY TEAM HAS ARRANGED FOR AN E-VISIT FOR YOUR APPOINTMENT - PLEASE REVIEW IMPORTANT INFORMATION BELOW SEVERAL DAYS PRIOR TO YOUR APPOINTMENT  Due to the recent COVID-19 pandemic, we are transitioning in-person office visits to tele-medicine visits in an effort to decrease unnecessary exposure to our patients, their families, and staff. These visits are billed to your insurance just like a normal visit is. We also encourage you to sign up for MyChart if you have not already done so. You will need a smartphone if possible. For patients that do not have this, we can still complete the visit using a regular telephone but do prefer a smartphone to enable video when possible. You may have a family member that lives with you that can help. If possible, we also ask that you have a blood pressure cuff and scale at home to measure your blood pressure, heart rate and weight prior to your scheduled appointment. Patients with clinical needs that need an in-person evaluation and testing will still be able to come to the office if absolutely necessary. If you have any questions, feel free to call our office.     YOUR PROVIDER WILL BE USING THE FOLLOWING PLATFORM TO COMPLETE YOUR VISIT: yes  . IF USING MYCHART - How to Download the MyChart App to Your SmartPhone   - If Apple, go to [employer] and type in MyChart in the search bar and download the app. If Android, ask patient to go to [employer] and type in [place] in the search bar and download the app. The app is free but as with any other app downloads, your phone may require you to verify saved payment information or Apple/Android password.  - You will need to then log into the app with your MyChart username and password, and select [facility] as your healthcare provider to link the account.  - When it is time for your visit, go to the MyChart app, find appointments, and click Begin Video Visit. Be sure to Select Allow for your device to access  the Microphone and Camera for your visit. You will then be connected, and your provider will be with you shortly.  **If you have any issues connecting or need assistance, please contact MyChart service desk [phone])**  **If using a computer, in order to ensure the best quality for your visit, you will need to use either of the following Internet Browsers: Insurance underwriter or [employer]**  . IF USING DOXIMITY or [email or web address].ME - The staff will give you instructions on receiving your link to join the meeting the day of your visit.      2-3 DAYS BEFORE YOUR APPOINTMENT  You will receive a telephone call from one of our [place] team members - your caller ID may say "Unknown caller." If this is a video visit, we will walk you through how to get the video launched on your phone. We will remind you check your blood pressure, heart rate and weight prior to your scheduled appointment. If you have an Apple Watch or Kardia, please upload any pertinent ECG strips the day before or morning of your appointment to [facility]. Our staff will also make sure you have reviewed the consent and agree to move forward with your scheduled tele-health visit.     THE DAY OF YOUR APPOINTMENT  Approximately 15 minutes prior to your scheduled appointment, you will receive a telephone call from one of [facility] team - your caller ID may say "Unknown caller."  Our staff will confirm medications, vital signs for the day and any symptoms you may be experiencing. Please have this information available prior to the time of visit start. It may also be helpful for you to have a pad of paper and pen handy for any instructions given during your visit. They will also walk you through joining the smartphone meeting if this is a video visit.    CONSENT FOR TELE-HEALTH VISIT - PLEASE REVIEW  I hereby voluntarily request, consent and authorize [facility] and its employed or contracted physicians, physician assistants,  nurse practitioners or other licensed health care professionals (the Practitioner), to provide me with telemedicine health care services (the "Services") as deemed necessary by the treating Practitioner. I acknowledge and consent to receive the Services by the Practitioner via telemedicine. I understand that the telemedicine visit will involve communicating with the Practitioner through live audiovisual communication technology and the disclosure of certain medical information by electronic transmission. I acknowledge that I have been given the opportunity to request an in-person assessment or other available alternative prior to the telemedicine visit and am voluntarily participating in the telemedicine visit.  I understand that I have the right to withhold or withdraw my consent to the use of telemedicine in the course of my care at any time, without affecting my right to future care or treatment, and that the Practitioner or I may terminate the telemedicine visit at any time. I understand that I have the right to inspect all information obtained and/or recorded in the course of the telemedicine visit and may receive copies of available information for a reasonable fee.  I understand that some of the potential risks of receiving the Services via telemedicine include:  . Delay or interruption in medical evaluation due to technological equipment failure or disruption; . Information transmitted may not be sufficient (e.g. poor resolution of images) to allow for appropriate medical decision making by the Practitioner; and/or  . In rare instances, security protocols could fail, causing a breach of personal health information.  Furthermore, I acknowledge that it is my responsibility to provide information about my medical history, conditions and care that is complete and accurate to the best of my ability. I acknowledge that Practitioner's advice, recommendations, and/or decision may be based on factors not  within their control, such as incomplete or inaccurate data provided by me or distortions of diagnostic images or specimens that may result from electronic transmissions. I understand that the practice of medicine is not an exact science and that Practitioner makes no warranties or guarantees regarding treatment outcomes. I acknowledge that I will receive a copy of this consent concurrently upon execution via email to the email address I last provided but may also request a printed copy by calling the office of [facility].    I understand that my insurance will be billed for this visit.   I have read or had this consent read to me. . I understand the contents of this consent, which adequately explains the benefits and risks of the Services being provided via telemedicine.  . I have been provided ample opportunity to ask questions regarding this consent and the Services and have had my questions answered to my satisfaction. . I give my informed consent for the services to be provided through the use of telemedicine in my medical care  By participating in this telemedicine visit I agree to the above.  

## 2018-08-30 ENCOUNTER — Other Ambulatory Visit: Payer: Self-pay

## 2018-08-30 ENCOUNTER — Telehealth (INDEPENDENT_AMBULATORY_CARE_PROVIDER_SITE_OTHER): Payer: Medicare HMO | Admitting: Interventional Cardiology

## 2018-08-30 ENCOUNTER — Encounter: Payer: Self-pay | Admitting: Interventional Cardiology

## 2018-08-30 VITALS — BP 130/62 | Ht 73.0 in | Wt 168.0 lb

## 2018-08-30 DIAGNOSIS — I10 Essential (primary) hypertension: Secondary | ICD-10-CM

## 2018-08-30 DIAGNOSIS — I2510 Atherosclerotic heart disease of native coronary artery without angina pectoris: Secondary | ICD-10-CM | POA: Diagnosis not present

## 2018-08-30 DIAGNOSIS — E1159 Type 2 diabetes mellitus with other circulatory complications: Secondary | ICD-10-CM

## 2018-08-30 DIAGNOSIS — E782 Mixed hyperlipidemia: Secondary | ICD-10-CM

## 2018-08-30 DIAGNOSIS — I5032 Chronic diastolic (congestive) heart failure: Secondary | ICD-10-CM

## 2018-08-30 DIAGNOSIS — Z7189 Other specified counseling: Secondary | ICD-10-CM

## 2018-08-30 MED ORDER — CARVEDILOL 25 MG PO TABS: 25.0000 mg | ORAL_TABLET | Freq: Two times a day (BID) | ORAL | 3 refills | Status: DC

## 2018-08-30 NOTE — Patient Instructions (Signed)
Medication Instructions:  Your physician recommends that you continue on your current medications as directed. Please refer to the Current Medication list given to you today.  If you need a refill on your cardiac medications before your next appointment, please call your pharmacy.   Lab work: None If you have labs (blood work) drawn today and your tests are completely normal, you will receive your results only by: Marland Kitchen MyChart Message (if you have MyChart) OR . A paper copy in the mail If you have any lab test that is abnormal or we need to change your treatment, we will call you to review the results.  Testing/Procedures: Your physician has requested that you have an echocardiogram just prior to seeing Dr. Tamala Julian back. Echocardiography is a painless test that uses sound waves to create images of your heart. It provides your doctor with information about the size and shape of your heart and how well your heart's chambers and valves are working. This procedure takes approximately one hour. There are no restrictions for this procedure.    Follow-Up: At [facility], you and your health needs are our priority.  As part of our continuing mission to provide you with exceptional heart care, we have created designated Provider Care Teams.  These Care Teams include your primary Cardiologist (physician) and Advanced Practice Providers (APPs -  Physician Assistants and Nurse Practitioners) who all work together to provide you with the care you need, when you need it. You will need a follow up appointment in 6-9 months.  Please call our office 2 months in advance to schedule this appointment.  You may see Sinclair Grooms, MD or one of the following Advanced Practice Providers on your designated Care Team:   Truitt Merle, NP Cecilie Kicks, NP . Kathyrn Drown, NP  Any Other Special Instructions Will Be Listed Below (If Applicable).

## 2018-09-10 ENCOUNTER — Ambulatory Visit: Payer: Medicare HMO | Admitting: Interventional Cardiology

## 2018-10-22 DIAGNOSIS — R21 Rash and other nonspecific skin eruption: Secondary | ICD-10-CM | POA: Diagnosis not present

## 2018-10-24 DIAGNOSIS — Z7189 Other specified counseling: Secondary | ICD-10-CM | POA: Diagnosis not present

## 2018-10-24 DIAGNOSIS — E785 Hyperlipidemia, unspecified: Secondary | ICD-10-CM | POA: Diagnosis not present

## 2018-10-24 DIAGNOSIS — Z6821 Body mass index (BMI) 21.0-21.9, adult: Secondary | ICD-10-CM | POA: Diagnosis not present

## 2018-10-24 DIAGNOSIS — F17210 Nicotine dependence, cigarettes, uncomplicated: Secondary | ICD-10-CM | POA: Diagnosis not present

## 2018-10-24 DIAGNOSIS — E118 Type 2 diabetes mellitus with unspecified complications: Secondary | ICD-10-CM | POA: Diagnosis not present

## 2018-10-24 DIAGNOSIS — I2510 Atherosclerotic heart disease of native coronary artery without angina pectoris: Secondary | ICD-10-CM | POA: Diagnosis not present

## 2018-10-24 DIAGNOSIS — E1165 Type 2 diabetes mellitus with hyperglycemia: Secondary | ICD-10-CM | POA: Diagnosis not present

## 2018-11-07 DIAGNOSIS — E1165 Type 2 diabetes mellitus with hyperglycemia: Secondary | ICD-10-CM | POA: Diagnosis not present

## 2018-11-07 DIAGNOSIS — I2510 Atherosclerotic heart disease of native coronary artery without angina pectoris: Secondary | ICD-10-CM | POA: Diagnosis not present

## 2018-11-07 DIAGNOSIS — Z7189 Other specified counseling: Secondary | ICD-10-CM | POA: Diagnosis not present

## 2018-11-07 DIAGNOSIS — E785 Hyperlipidemia, unspecified: Secondary | ICD-10-CM | POA: Diagnosis not present

## 2018-11-07 DIAGNOSIS — E118 Type 2 diabetes mellitus with unspecified complications: Secondary | ICD-10-CM | POA: Diagnosis not present

## 2018-11-07 DIAGNOSIS — F17210 Nicotine dependence, cigarettes, uncomplicated: Secondary | ICD-10-CM | POA: Diagnosis not present

## 2018-11-07 DIAGNOSIS — Z6821 Body mass index (BMI) 21.0-21.9, adult: Secondary | ICD-10-CM | POA: Diagnosis not present

## 2018-11-29 DIAGNOSIS — Z6821 Body mass index (BMI) 21.0-21.9, adult: Secondary | ICD-10-CM | POA: Diagnosis not present

## 2018-11-29 DIAGNOSIS — I2510 Atherosclerotic heart disease of native coronary artery without angina pectoris: Secondary | ICD-10-CM | POA: Diagnosis not present

## 2018-11-29 DIAGNOSIS — E1165 Type 2 diabetes mellitus with hyperglycemia: Secondary | ICD-10-CM | POA: Diagnosis not present

## 2018-11-29 DIAGNOSIS — Z7189 Other specified counseling: Secondary | ICD-10-CM | POA: Diagnosis not present

## 2018-11-29 DIAGNOSIS — E785 Hyperlipidemia, unspecified: Secondary | ICD-10-CM | POA: Diagnosis not present

## 2018-11-29 DIAGNOSIS — F17210 Nicotine dependence, cigarettes, uncomplicated: Secondary | ICD-10-CM | POA: Diagnosis not present

## 2018-11-29 DIAGNOSIS — E118 Type 2 diabetes mellitus with unspecified complications: Secondary | ICD-10-CM | POA: Diagnosis not present

## 2019-01-10 DIAGNOSIS — E785 Hyperlipidemia, unspecified: Secondary | ICD-10-CM | POA: Diagnosis not present

## 2019-01-10 DIAGNOSIS — Z6821 Body mass index (BMI) 21.0-21.9, adult: Secondary | ICD-10-CM | POA: Diagnosis not present

## 2019-01-10 DIAGNOSIS — F17210 Nicotine dependence, cigarettes, uncomplicated: Secondary | ICD-10-CM | POA: Diagnosis not present

## 2019-01-10 DIAGNOSIS — Z7189 Other specified counseling: Secondary | ICD-10-CM | POA: Diagnosis not present

## 2019-01-10 DIAGNOSIS — E1165 Type 2 diabetes mellitus with hyperglycemia: Secondary | ICD-10-CM | POA: Diagnosis not present

## 2019-01-10 DIAGNOSIS — E118 Type 2 diabetes mellitus with unspecified complications: Secondary | ICD-10-CM | POA: Diagnosis not present

## 2019-01-10 DIAGNOSIS — I2510 Atherosclerotic heart disease of native coronary artery without angina pectoris: Secondary | ICD-10-CM | POA: Diagnosis not present

## 2019-01-10 DIAGNOSIS — Z23 Encounter for immunization: Secondary | ICD-10-CM | POA: Diagnosis not present

## 2019-03-08 ENCOUNTER — Encounter (HOSPITAL_COMMUNITY): Payer: Self-pay | Admitting: Interventional Cardiology

## 2019-03-11 ENCOUNTER — Other Ambulatory Visit (HOSPITAL_COMMUNITY): Payer: Medicare HMO

## 2019-04-15 ENCOUNTER — Other Ambulatory Visit (HOSPITAL_COMMUNITY): Payer: Medicare HMO

## 2019-05-07 ENCOUNTER — Telehealth: Payer: Self-pay | Admitting: Interventional Cardiology

## 2019-05-07 ENCOUNTER — Other Ambulatory Visit: Payer: Self-pay

## 2019-05-07 MED ORDER — ATORVASTATIN CALCIUM 40 MG PO TABS: 40.0000 mg | ORAL_TABLET | Freq: Every day | ORAL | 2 refills | Status: DC

## 2019-05-07 NOTE — Telephone Encounter (Signed)
*  STAT* If patient is at the pharmacy, call can be transferred to refill team.   1. Which medications need to be refilled? (please list name of each medication and dose if known) atorvastatin (LIPITOR) 40 MG tablet  2. Which pharmacy/location (including street and city if local pharmacy) is medication to be sent to? [place], [place]  3. Do they need a 30 day or 90 day supply? 90 day   Patient only has 2 tablets of medication left.

## 2019-06-01 ENCOUNTER — Ambulatory Visit: Payer: Self-pay

## 2019-06-01 ENCOUNTER — Ambulatory Visit: Payer: Medicare HMO | Attending: Internal Medicine

## 2019-06-01 DIAGNOSIS — Z23 Encounter for immunization: Secondary | ICD-10-CM | POA: Insufficient documentation

## 2019-06-01 NOTE — Progress Notes (Signed)
   Covid-19 Vaccination Clinic  Name:  Chad [name]    MRN: [id number] DOB: [birth year]  06/01/2019  Chad [name] was observed post Covid-19 immunization for 15 minutes without incidence. He was provided with Vaccine Information Sheet and instruction to access the V-Safe system.   Chad [name] was instructed to call 911 with any severe reactions post vaccine: Marland Kitchen Difficulty breathing  . Swelling of your face and throat  . A fast heartbeat  . A bad rash all over your body  . Dizziness and weakness    Immunizations Administered    Name Date Dose VIS Date Route   Pfizer COVID-19 Vaccine 06/01/2019  2:12 PM 0.3 mL 04/19/2019 Intramuscular   Manufacturer: [place]   Lot: [id number]   [place]: [id number]

## 2019-06-22 ENCOUNTER — Ambulatory Visit: Payer: Medicare HMO | Attending: Internal Medicine

## 2019-06-22 DIAGNOSIS — Z23 Encounter for immunization: Secondary | ICD-10-CM | POA: Insufficient documentation

## 2019-06-22 NOTE — Progress Notes (Signed)
   Covid-19 Vaccination Clinic  Name:  Chad [name]    MRN: [id number] DOB: [birth year]  06/22/2019  Chad [name] was observed post Covid-19 immunization for 15 minutes without incidence. He was provided with Vaccine Information Sheet and instruction to access the V-Safe system.   Chad [name] was instructed to call 911 with any severe reactions post vaccine: Marland Kitchen Difficulty breathing  . Swelling of your face and throat  . A fast heartbeat  . A bad rash all over your body  . Dizziness and weakness    Immunizations Administered    Name Date Dose VIS Date Route   Pfizer COVID-19 Vaccine 06/22/2019  1:02 PM 0.3 mL 04/19/2019 Intramuscular   Manufacturer: [place]   Lot: [id number]   [place]: [id number]

## 2019-12-09 DIAGNOSIS — F17210 Nicotine dependence, cigarettes, uncomplicated: Secondary | ICD-10-CM | POA: Diagnosis not present

## 2019-12-09 DIAGNOSIS — Z6821 Body mass index (BMI) 21.0-21.9, adult: Secondary | ICD-10-CM | POA: Diagnosis not present

## 2019-12-09 DIAGNOSIS — E1165 Type 2 diabetes mellitus with hyperglycemia: Secondary | ICD-10-CM | POA: Diagnosis not present

## 2019-12-09 DIAGNOSIS — Z7189 Other specified counseling: Secondary | ICD-10-CM | POA: Diagnosis not present

## 2019-12-09 DIAGNOSIS — E118 Type 2 diabetes mellitus with unspecified complications: Secondary | ICD-10-CM | POA: Diagnosis not present

## 2019-12-09 DIAGNOSIS — E785 Hyperlipidemia, unspecified: Secondary | ICD-10-CM | POA: Diagnosis not present

## 2019-12-09 DIAGNOSIS — I2510 Atherosclerotic heart disease of native coronary artery without angina pectoris: Secondary | ICD-10-CM | POA: Diagnosis not present

## 2019-12-16 DIAGNOSIS — H5210 Myopia, unspecified eye: Secondary | ICD-10-CM | POA: Diagnosis not present

## 2020-01-20 DIAGNOSIS — F17210 Nicotine dependence, cigarettes, uncomplicated: Secondary | ICD-10-CM | POA: Diagnosis not present

## 2020-01-20 DIAGNOSIS — Z6821 Body mass index (BMI) 21.0-21.9, adult: Secondary | ICD-10-CM | POA: Diagnosis not present

## 2020-01-20 DIAGNOSIS — I2510 Atherosclerotic heart disease of native coronary artery without angina pectoris: Secondary | ICD-10-CM | POA: Diagnosis not present

## 2020-01-20 DIAGNOSIS — E118 Type 2 diabetes mellitus with unspecified complications: Secondary | ICD-10-CM | POA: Diagnosis not present

## 2020-01-20 DIAGNOSIS — E785 Hyperlipidemia, unspecified: Secondary | ICD-10-CM | POA: Diagnosis not present

## 2020-01-20 DIAGNOSIS — E1165 Type 2 diabetes mellitus with hyperglycemia: Secondary | ICD-10-CM | POA: Diagnosis not present

## 2020-01-21 DIAGNOSIS — H40023 Open angle with borderline findings, high risk, bilateral: Secondary | ICD-10-CM | POA: Diagnosis not present

## 2020-03-02 DIAGNOSIS — E1165 Type 2 diabetes mellitus with hyperglycemia: Secondary | ICD-10-CM | POA: Diagnosis not present

## 2020-03-02 DIAGNOSIS — Z6821 Body mass index (BMI) 21.0-21.9, adult: Secondary | ICD-10-CM | POA: Diagnosis not present

## 2020-03-02 DIAGNOSIS — E118 Type 2 diabetes mellitus with unspecified complications: Secondary | ICD-10-CM | POA: Diagnosis not present

## 2020-03-02 DIAGNOSIS — E785 Hyperlipidemia, unspecified: Secondary | ICD-10-CM | POA: Diagnosis not present

## 2020-03-02 DIAGNOSIS — F17210 Nicotine dependence, cigarettes, uncomplicated: Secondary | ICD-10-CM | POA: Diagnosis not present

## 2020-03-02 DIAGNOSIS — I2510 Atherosclerotic heart disease of native coronary artery without angina pectoris: Secondary | ICD-10-CM | POA: Diagnosis not present

## 2020-04-22 ENCOUNTER — Other Ambulatory Visit: Payer: Self-pay | Admitting: Interventional Cardiology

## 2020-04-22 ENCOUNTER — Other Ambulatory Visit: Payer: Self-pay

## 2020-04-22 MED ORDER — CARVEDILOL 25 MG PO TABS: 25.0000 mg | ORAL_TABLET | Freq: Two times a day (BID) | ORAL | 0 refills | Status: AC

## 2020-05-07 DIAGNOSIS — I509 Heart failure, unspecified: Secondary | ICD-10-CM | POA: Diagnosis not present

## 2020-05-07 DIAGNOSIS — Z79899 Other long term (current) drug therapy: Secondary | ICD-10-CM | POA: Diagnosis not present

## 2020-05-07 DIAGNOSIS — I2510 Atherosclerotic heart disease of native coronary artery without angina pectoris: Secondary | ICD-10-CM | POA: Diagnosis not present

## 2020-05-07 DIAGNOSIS — R413 Other amnesia: Secondary | ICD-10-CM | POA: Diagnosis not present

## 2020-05-07 DIAGNOSIS — E1159 Type 2 diabetes mellitus with other circulatory complications: Secondary | ICD-10-CM | POA: Diagnosis not present

## 2020-05-07 DIAGNOSIS — Z7984 Long term (current) use of oral hypoglycemic drugs: Secondary | ICD-10-CM | POA: Diagnosis not present

## 2020-05-07 DIAGNOSIS — I10 Essential (primary) hypertension: Secondary | ICD-10-CM | POA: Diagnosis not present

## 2020-05-07 DIAGNOSIS — E7800 Pure hypercholesterolemia, unspecified: Secondary | ICD-10-CM | POA: Diagnosis not present

## 2020-06-21 DIAGNOSIS — I499 Cardiac arrhythmia, unspecified: Secondary | ICD-10-CM | POA: Diagnosis not present

## 2020-06-21 DIAGNOSIS — R404 Transient alteration of awareness: Secondary | ICD-10-CM | POA: Diagnosis not present

## 2020-06-21 DIAGNOSIS — I469 Cardiac arrest, cause unspecified: Secondary | ICD-10-CM | POA: Diagnosis not present

## 2020-06-21 DIAGNOSIS — E1165 Type 2 diabetes mellitus with hyperglycemia: Secondary | ICD-10-CM | POA: Diagnosis not present

## 2020-07-07 DIAGNOSIS — 419620001 Death: Secondary | SNOMED CT | POA: Diagnosis not present

## 2020-07-07 DEATH — deceased
